# Patient Record
Sex: Female | Born: 1990 | Race: Black or African American | Hispanic: No | Marital: Married | Smoking: Never smoker
Health system: Southern US, Community
[De-identification: ages and names within clinical notes are randomized; demographics above are authoritative.]

## PROBLEM LIST (undated history)

## (undated) DIAGNOSIS — J45909 Unspecified asthma, uncomplicated: Secondary | ICD-10-CM

## (undated) DIAGNOSIS — F419 Anxiety disorder, unspecified: Secondary | ICD-10-CM

## (undated) DIAGNOSIS — D649 Anemia, unspecified: Secondary | ICD-10-CM

## (undated) HISTORY — DX: Anemia, unspecified: D64.9

## (undated) HISTORY — PX: OTHER SURGICAL HISTORY: SHX169

## (undated) HISTORY — DX: Anxiety disorder, unspecified: F41.9

## (undated) HISTORY — DX: Unspecified asthma, uncomplicated: J45.909

---

## 2018-05-20 ENCOUNTER — Other Ambulatory Visit: Payer: Self-pay

## 2018-05-20 ENCOUNTER — Emergency Department
Admission: EM | Admit: 2018-05-20 | Discharge: 2018-05-20 | Disposition: A | Discharge status: Home / Self Care | Payer: Self-pay | Attending: Emergency Medicine | Admitting: Emergency Medicine

## 2018-05-20 ENCOUNTER — Encounter: Payer: Self-pay | Admitting: Emergency Medicine

## 2018-05-20 DIAGNOSIS — L42 Pityriasis rosea: Secondary | ICD-10-CM | POA: Insufficient documentation

## 2018-05-20 MED ORDER — TRAMADOL HCL 50 MG PO TABS: 50.0000 mg | ORAL_TABLET | Freq: Four times a day (QID) | ORAL | 0 refills | Status: DC | PRN

## 2018-05-20 MED ORDER — HYDROXYZINE HCL 10 MG PO TABS: 10.0000 mg | ORAL_TABLET | Freq: Three times a day (TID) | ORAL | 0 refills | Status: AC | PRN

## 2018-05-20 MED ORDER — CICLOPIROX 8 % EX SOLN: Freq: Every day | CUTANEOUS | 0 refills | Status: DC

## 2018-05-20 NOTE — ED Triage Notes (Signed)
Itchy rash body x 4 days.

## 2018-05-21 NOTE — ED Provider Notes (Signed)
[facility] Emergency Department Provider Note  ____________________________________________  Time seen: Approximately 12:27 AM  I have reviewed the triage vital signs and the nursing notes.   HISTORY  Chief Complaint Rash    HPI Sharon Salazar is a 28 y.o. female presents to the emergency department with a scaling, maculopapular, circumferential rash that is pruritic in nature for the past 4 days.  Rash started with a large patch under her left arm and has since spread across the trunk.  Patient denies a prodrome of rhinorrhea, congestion or nonproductive cough.  No fever or chills.  Patient denies recent pharyngitis.  No other sick contacts in the home with similar symptoms.  Patient has not experienced similar symptoms in the past.  Patient also reports that she has an abnormal looking left great toenail.   History reviewed. No pertinent past medical history.  There are no active problems to display for this patient.   History reviewed. No pertinent surgical history.  Prior to Admission medications   Medication Sig Start Date End Date Taking? Authorizing Provider  ciclopirox (PENLAC) 8 % solution Apply topically at bedtime. Apply over nail and surrounding skin. Apply daily over previous coat. After seven (7) days, may remove with alcohol and continue cycle. 05/20/18   Orvil Feil, PA-C  hydrOXYzine (ATARAX/VISTARIL) 10 MG tablet Take 1 tablet (10 mg total) by mouth 3 (three) times daily as needed for up to 14 days. 05/20/18 06/03/18  Orvil Feil, PA-C    Allergies Patient has no known allergies.  No family history on file.  Social History Social History   Tobacco Use  . Smoking status: Not on file  Substance Use Topics  . Alcohol use: Not on file  . Drug use: Not on file     Review of Systems  Constitutional: No fever/chills Eyes: No visual changes. No discharge ENT: No upper respiratory complaints. Cardiovascular: no chest  pain. Respiratory: no cough. No SOB. Gastrointestinal: No abdominal pain.  No nausea, no vomiting.  No diarrhea.  No constipation. Genitourinary: Negative for dysuria. No hematuria Musculoskeletal: Negative for musculoskeletal pain. Skin: Patient has rash.  Neurological: Negative for headaches, focal weakness or numbness.   ____________________________________________   PHYSICAL EXAM:  VITAL SIGNS: ED Triage Vitals  Enc Vitals Group     BP 05/20/18 1749 136/66     Pulse Rate 05/20/18 1749 83     Resp 05/20/18 1749 18     Temp 05/20/18 1749 98.3 F (36.8 C)     Temp Source 05/20/18 1749 Oral     SpO2 05/20/18 1749 100 %     Weight 05/20/18 1750 142 lb (64.4 kg)     Height 05/20/18 1750 5\' 7"  (1.702 m)     Head Circumference --      Peak Flow --      Pain Score 05/20/18 1753 0     Pain Loc --      Pain Edu? --      Excl. in [place]? --      Constitutional: Alert and oriented. Well appearing and in no acute distress. Eyes: Conjunctivae are normal. PERRL. EOMI. Head: Atraumatic. Cardiovascular: Normal rate, regular rhythm. Normal S1 and S2.  Good peripheral circulation. Respiratory: Normal respiratory effort without tachypnea or retractions. Lungs CTAB. Good air entry to the bases with no decreased or absent breath sounds. Musculoskeletal: Full range of motion to all extremities. No gross deformities appreciated. Neurologic:  Normal speech and language. No gross focal neurologic deficits  are appreciated.  Skin: Patient has scaling, circumferential, maculopapular rash with herald patch visualized left upper arm.  Patient also has onychomycosis of the left great toe Psychiatric: Mood and affect are normal. Speech and behavior are normal. Patient exhibits appropriate insight and judgement.   ____________________________________________   LABS (all labs ordered are listed, but only abnormal results are displayed)  Labs Reviewed - No data to  display ____________________________________________  EKG   ____________________________________________  RADIOLOGY  No results found.  ____________________________________________    PROCEDURES  Procedure(s) performed:    Procedures    Medications - No data to display   ____________________________________________   INITIAL IMPRESSION / ASSESSMENT AND PLAN / ED COURSE  Pertinent labs & imaging results that were available during my care of the patient were reviewed by me and considered in my medical decision making (see chart for details).  Review of the  [place] was performed in accordance of the [facility] prior to dispensing any controlled drugs.      Assessment and plan Pityriasis rosea Onychomycosis Patient presents to the emergency department with a rash consistent with pityriasis rosea.  Patient education regarding the self-limiting nature of pityriasis rosea was given.  Hydroxyzine was recommended and prescribed for pruritus.  Patient was also prescribed ciclopirox for onychomycosis.  She was advised to follow-up with primary care as needed.  All patient questions were answered.    ____________________________________________  FINAL CLINICAL IMPRESSION(S) / ED DIAGNOSES  Final diagnoses:  Pityriasis rosea      NEW MEDICATIONS STARTED DURING THIS VISIT:  ED Discharge Orders         Ordered    traMADol (ULTRAM) 50 MG tablet  Every 6 hours PRN,   Status:  Discontinued     05/20/18 1955    traMADol (ULTRAM) 50 MG tablet  Every 6 hours PRN,   Status:  Discontinued     05/20/18 1955    hydrOXYzine (ATARAX/VISTARIL) 10 MG tablet  3 times daily PRN     05/20/18 1959    ciclopirox (PENLAC) 8 % solution  Daily at bedtime     05/20/18 1959              This chart was dictated using voice recognition software/Dragon. Despite best efforts to proofread, errors can occur which can change the meaning. Any change was purely unintentional.    Orvil Feil, PA-C 05/21/18 4383    Sharyn Creamer, MD 05/21/18 2209

## 2018-12-24 ENCOUNTER — Emergency Department: Payer: Self-pay

## 2018-12-24 ENCOUNTER — Encounter: Payer: Self-pay | Admitting: Emergency Medicine

## 2018-12-24 ENCOUNTER — Emergency Department
Admission: EM | Admit: 2018-12-24 | Discharge: 2018-12-24 | Disposition: A | Discharge status: Home / Self Care | Payer: Self-pay | Attending: Emergency Medicine | Admitting: Emergency Medicine

## 2018-12-24 ENCOUNTER — Other Ambulatory Visit: Payer: Self-pay

## 2018-12-24 DIAGNOSIS — Z23 Encounter for immunization: Secondary | ICD-10-CM | POA: Insufficient documentation

## 2018-12-24 DIAGNOSIS — S01112A Laceration without foreign body of left eyelid and periocular area, initial encounter: Secondary | ICD-10-CM | POA: Insufficient documentation

## 2018-12-24 DIAGNOSIS — S20212A Contusion of left front wall of thorax, initial encounter: Secondary | ICD-10-CM | POA: Insufficient documentation

## 2018-12-24 DIAGNOSIS — Y929 Unspecified place or not applicable: Secondary | ICD-10-CM | POA: Insufficient documentation

## 2018-12-24 DIAGNOSIS — Y999 Unspecified external cause status: Secondary | ICD-10-CM | POA: Insufficient documentation

## 2018-12-24 DIAGNOSIS — S0512XA Contusion of eyeball and orbital tissues, left eye, initial encounter: Secondary | ICD-10-CM | POA: Insufficient documentation

## 2018-12-24 DIAGNOSIS — S0181XA Laceration without foreign body of other part of head, initial encounter: Secondary | ICD-10-CM

## 2018-12-24 DIAGNOSIS — Y939 Activity, unspecified: Secondary | ICD-10-CM | POA: Insufficient documentation

## 2018-12-24 MED ORDER — IBUPROFEN 600 MG PO TABS: 600.0000 mg | ORAL_TABLET | Freq: Four times a day (QID) | ORAL | 0 refills | Status: DC | PRN

## 2018-12-24 MED ORDER — TETANUS-DIPHTHERIA TOXOIDS TD 5-2 LFU IM INJ
0.5000 mL | INJECTION | Freq: Once | INTRAMUSCULAR | Status: DC
  Filled 2018-12-24: qty 0.5

## 2018-12-24 MED ORDER — ACETAMINOPHEN 500 MG PO TABS
1000.0000 mg | ORAL_TABLET | Freq: Once | ORAL | Status: AC
  Administered 2018-12-24: 1000 mg via ORAL
  Filled 2018-12-24: qty 2

## 2018-12-24 MED ORDER — TETANUS-DIPHTH-ACELL PERTUSSIS 5-2.5-18.5 LF-MCG/0.5 IM SUSP
0.5000 mL | Freq: Once | INTRAMUSCULAR | Status: AC
  Administered 2018-12-24: 16:00:00 0.5 mL via INTRAMUSCULAR
  Filled 2018-12-24: qty 0.5
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
## 2018-12-24 NOTE — Discharge Instructions (Signed)
Please keep Dermabond clean and dry for 5 days.  In 5 days you may shower and get wet.  If any worsening headaches, vision changes, nausea, vomiting, chest pain, shortness of breath, return to the emergency department.  You may take ibuprofen and Tylenol as needed for pain.

## 2019-02-26 ENCOUNTER — Other Ambulatory Visit: Payer: Self-pay

## 2019-02-26 ENCOUNTER — Emergency Department
Admission: EM | Admit: 2019-02-26 | Discharge: 2019-02-26 | Disposition: A | Discharge status: Home / Self Care | Payer: Self-pay | Attending: Emergency Medicine | Admitting: Emergency Medicine

## 2019-02-26 ENCOUNTER — Encounter: Payer: Self-pay | Admitting: Emergency Medicine

## 2019-02-26 DIAGNOSIS — G44209 Tension-type headache, unspecified, not intractable: Secondary | ICD-10-CM | POA: Insufficient documentation

## 2019-02-26 DIAGNOSIS — B351 Tinea unguium: Secondary | ICD-10-CM | POA: Insufficient documentation

## 2019-02-26 MED ORDER — BUTALBITAL-APAP-CAFFEINE 50-325-40 MG PO TABS: 1.0000 | ORAL_TABLET | Freq: Four times a day (QID) | ORAL | 0 refills | Status: DC | PRN

## 2019-02-26 NOTE — Discharge Instructions (Signed)
[facility] for your [facility] fungus.  Begin taking the medication for your headache.  Do not drive or operate machinery while taking that medication as it could cause drowsiness and increase your risk for injury.  You may use ice or heat to your face and neck as needed for discomfort.  Call make an appointment with the ophthalmologist listed on your discharge papers also to have your vision checked.

## 2019-02-26 NOTE — ED Notes (Signed)
See triage note  Presents with left sided headache for 1 week  Denies any fever  States headaches is getting worse  Also has some body aches  Afebrile on arrival   States she has not tried any po meds for headache

## 2019-02-26 NOTE — ED Provider Notes (Signed)
[facility] Emergency Department Provider Note  ____________________________________________   None    (approximate)  I have reviewed the triage vital signs and the nursing notes.   HISTORY  Chief Complaint Headache   HPI Sharon Salazar is a 28 y.o. female presents to the ED with complaint of left-sided headache for 1 week.  Patient denies any head injury.  Patient is unaware of any fever and reports that she has not taken any over-the-counter medication for her headache.  Patient denies any visual changes other than she needs glasses.  She states that occasionally it feels like she "slept wrong on her neck".  She also complains of tenderness on the left side of her neck.  There is been no nausea or vomiting.  No previous history of headaches.  She denies any known Covid exposure or symptoms suspicious for Covid.  She rates her pain as 9 out of 10.     History reviewed. No pertinent past medical history.  There are no active problems to display for this patient.   History reviewed. No pertinent surgical history.  Prior to Admission medications   Medication Sig Start Date End Date Taking? Authorizing Provider  butalbital-acetaminophen-caffeine (FIORICET) 50-325-40 MG tablet Take 1 tablet by mouth every 6 (six) hours as needed for headache. 02/26/19 02/26/20  Johnn Hai, PA-C    Allergies Patient has no known allergies.  No family history on file.  Social History Social History   Tobacco Use  . Smoking status: Never Smoker  . Smokeless tobacco: Never Used  Substance Use Topics  . Alcohol use: Not Currently  . Drug use: Not on file    Review of Systems Constitutional: No fever/chills Eyes: No visual changes. ENT: No sore throat.  Minimal drainage noted posterior pharynx. Cardiovascular: Denies chest pain. Respiratory: Denies shortness of breath. Gastrointestinal: No abdominal pain.  No nausea, no vomiting.  No diarrhea.   Genitourinary: Negative for dysuria. Musculoskeletal: Positive left-sided cervical muscle pain.  Negative for muscle aches. Skin: Negative for rash. Neurological: Positive for headaches, negative for focal weakness or numbness.  ____________________________________________   PHYSICAL EXAM:  VITAL SIGNS: ED Triage Vitals  Enc Vitals Group     BP 02/26/19 0723 120/60     Pulse Rate 02/26/19 0723 60     Resp 02/26/19 0723 16     Temp 02/26/19 0723 98.6 F (37 C)     Temp Source 02/26/19 0723 Oral     SpO2 02/26/19 0723 100 %     Weight 02/26/19 0720 150 lb (68 kg)     Height 02/26/19 0720 5\' 7"  (1.702 m)     Head Circumference --      Peak Flow --      Pain Score 02/26/19 0720 9     Pain Loc --      Pain Edu? --      Excl. in [place]? --    Constitutional: Alert and oriented. Well appearing and in no acute distress. Eyes: Conjunctivae are normal. PERRL. EOMI. Head: Atraumatic. Nose: No congestion/rhinnorhea. Mouth/Throat: Mucous membranes are moist.  Oropharynx non-erythematous.  Mild posterior drainage noted. Neck: No stridor.  There is some left lateral cervical muscle tenderness along with trapezius muscle tenderness.  Range of motion is nonrestricted however patient reports increased muscle tenderness with lateral range of motion of stress to the left trapezius muscle.  No nuchal rigidity noted. Hematological/Lymphatic/Immunilogical: No cervical lymphadenopathy. Cardiovascular: Normal rate, regular rhythm. Grossly normal heart sounds.  Good peripheral  circulation. Respiratory: Normal respiratory effort.  No retractions. Lungs CTAB. Musculoskeletal: Moves upper and lower extremities any difficulty.  Normal gait was noted. Neurologic:  Normal speech and language. No gross focal neurologic deficits are appreciated.  Cranial nerves II through XII grossly intact.  No gait instability. Skin:  Skin is warm, dry and intact. No rash noted. Psychiatric: Mood and affect are normal. Speech  and behavior are normal.  ____________________________________________   LABS (all labs ordered are listed, but only abnormal results are displayed)  Labs Reviewed - No data to display  PROCEDURES  Procedure(s) performed (including Critical Care):  Procedures ____________________________________________   INITIAL IMPRESSION / ASSESSMENT AND PLAN / ED COURSE  As part of my medical decision making, I reviewed the following data within the electronic MEDICAL RECORD NUMBER Notes from prior ED visits and [place] Controlled Substance Database  28 year old female presents presents to the ED with complaint of left-sided headache for 1 week.  There is been no history of injury and no symptoms suspicious for Covid.  Patient is afebrile and no visual changes other than patient states she needs glasses.  Does not take any over-the-counter medication for her headache.  Patient was able to drive herself to the ED.  Cranial nerves II through XII grossly intact and remaining physical exam was unremarkable with the exception of some left trapezius muscle and cervical muscle tenderness which can be reproduced with lateral range of motion.  Patient was given prescription for Fioricet to be taken at home.  She is encouraged to use ice or heat to her neck as needed for discomfort.  She will follow-up with her PCP if any continued problems.  ____________________________________________   FINAL CLINICAL IMPRESSION(S) / ED DIAGNOSES  Final diagnoses:  Muscle contraction headache  Fungal infection of nail     ED Discharge Orders         Ordered    butalbital-acetaminophen-caffeine (FIORICET) 50-325-40 MG tablet  Every 6 hours PRN     02/26/19 0759           Note:  This document was prepared using Dragon voice recognition software and may include unintentional dictation errors.    Tommi Rumps, PA-C 02/26/19 1211    Concha Se, MD 03/02/19 [phone]

## 2019-02-26 NOTE — ED Triage Notes (Signed)
Pt c/o headache to back left side of head to sinus pain and muscle aches for the past few days. NAD.

## 2019-04-26 ENCOUNTER — Emergency Department: Payer: Self-pay

## 2019-04-26 ENCOUNTER — Emergency Department
Admission: EM | Admit: 2019-04-26 | Discharge: 2019-04-26 | Disposition: A | Discharge status: Home / Self Care | Payer: Self-pay | Attending: Emergency Medicine | Admitting: Emergency Medicine

## 2019-04-26 ENCOUNTER — Other Ambulatory Visit: Payer: Self-pay

## 2019-04-26 ENCOUNTER — Encounter: Payer: Self-pay | Admitting: Emergency Medicine

## 2019-04-26 DIAGNOSIS — M79642 Pain in left hand: Secondary | ICD-10-CM | POA: Insufficient documentation

## 2019-04-26 DIAGNOSIS — K0889 Other specified disorders of teeth and supporting structures: Secondary | ICD-10-CM | POA: Insufficient documentation

## 2019-04-26 MED ORDER — NAPROXEN 500 MG PO TABS: 500.0000 mg | ORAL_TABLET | Freq: Two times a day (BID) | ORAL | 0 refills | Status: DC

## 2019-04-26 MED ORDER — AMOXICILLIN 875 MG PO TABS: 875.0000 mg | ORAL_TABLET | Freq: Two times a day (BID) | ORAL | 0 refills | Status: DC

## 2019-04-26 NOTE — ED Triage Notes (Signed)
Patient presents to the ED with left hand pain and swollen joints.  Patient denies any known injury.  Patient also reports some pain to her left arm, reports history of healed injury to left arm.  Patient states she is having an intermittent headache as well but no headache at this time.

## 2019-04-26 NOTE — ED Notes (Signed)
See triage note  Presents with pain to left hand  States she hit her hand on the dresser   having some pain and stiffness to hand  Also thinks she may have a dental infection pain and swelling noted to left side of face

## 2019-04-26 NOTE — ED Provider Notes (Signed)
[facility] Emergency Department Provider Note  ____________________________________________   First MD Initiated Contact with Patient 04/26/19 1119     (approximate)  I have reviewed the triage vital signs and the nursing notes.   HISTORY  Chief Complaint Hand Pain   HPI Sharon Salazar is a 29 y.o. female presents to the ED with complaint of left hand pain that started today.  Patient states she hit her hand on a dresser and has been having some pain and stiffness.  Patient is not taking any over-the-counter medication for this.  She also complains of dental pain on the left upper side.  She denies any fever, chills, nausea or vomiting.  She has not seen a dentist for her tooth.  She rates her pain as an 8 out of 10.       History reviewed. No pertinent past medical history.  There are no problems to display for this patient.   History reviewed. No pertinent surgical history.  Prior to Admission medications   Medication Sig Start Date End Date Taking? Authorizing Provider  amoxicillin (AMOXIL) 875 MG tablet Take 1 tablet (875 mg total) by mouth 2 (two) times daily. 04/26/19   Johnn Hai, PA-C  naproxen (NAPROSYN) 500 MG tablet Take 1 tablet (500 mg total) by mouth 2 (two) times daily with a meal. 04/26/19   Johnn Hai, PA-C    Allergies Patient has no known allergies.  No family history on file.  Social History Social History   Tobacco Use  . Smoking status: Never Smoker  . Smokeless tobacco: Never Used  Substance Use Topics  . Alcohol use: Not Currently  . Drug use: Not on file    Review of Systems Constitutional: No fever/chills Eyes: No visual changes. ENT: No sore throat.  Positive for dental pain. Cardiovascular: Denies chest pain. Respiratory: Denies shortness of breath. Gastrointestinal:  No nausea, no vomiting.  Genitourinary: Negative for dysuria. Musculoskeletal: Positive for left hand pain. Skin: Negative for  rash. Neurological: Negative for  focal weakness or numbness. ____________________________________________   PHYSICAL EXAM:  VITAL SIGNS: ED Triage Vitals [04/26/19 1104]  Enc Vitals Group     BP 118/63     Pulse Rate 64     Resp 16     Temp 98.9 F (37.2 C)     Temp Source Oral     SpO2 100 %     Weight 160 lb (72.6 kg)     Height 5\' 7"  (1.702 m)     Head Circumference      Peak Flow      Pain Score 8     Pain Loc      Pain Edu?      Excl. in [place]?    Constitutional: Alert and oriented. Well appearing and in no acute distress. Eyes: Conjunctivae are normal. Head: Atraumatic. Nose: No congestion/rhinnorhea. Mouth/Throat: Mucous membranes are moist.  Oropharynx non-erythematous.  On exam of the left upper premolar there is no obvious abscess.  Area is tender to palpation.  No drainage is noted. Neck: No stridor.   Hematological/Lymphatic/Immunilogical: No cervical lymphadenopathy. Cardiovascular: Normal rate, regular rhythm. Grossly normal heart sounds.  Good peripheral circulation. Respiratory: Normal respiratory effort.  No retractions. Lungs CTAB. Musculoskeletal: Examination of left hand there is no gross deformity however there is tenderness on palpation of the third, fourth and fifth metacarpal.  No soft tissue injury or edema is present.  Patient is able to move digits without any difficulty.  Skin is intact.  Capillary refill is less than 3 seconds and patient is able to flex and extend digits without any difficulty. Neurologic:  Normal speech and language. No gross focal neurologic deficits are appreciated. No gait instability. Skin:  Skin is warm, dry and intact.  No skin discoloration noted on the left hand. Psychiatric: Mood and affect are normal. Speech and behavior are normal.  ____________________________________________   LABS (all labs ordered are listed, but only abnormal results are displayed)  Labs Reviewed - No data to display RADIOLOGY   Official  radiology report(s): DG Hand Complete Left  Result Date: 04/26/2019 CLINICAL DATA:  Pain after hitting hand on solid object EXAM: LEFT HAND - COMPLETE 3+ VIEW COMPARISON:  None. FINDINGS: Frontal, oblique, and lateral views obtained. No evident fracture or dislocation. Joint spaces appear normal. No erosive change. IMPRESSION: No fracture or dislocation.  No appreciable arthropathy. Electronically Signed   By: Bretta Bang III M.D.   On: 04/26/2019 12:24    ____________________________________________   PROCEDURES  Procedure(s) performed (including Critical Care):  Procedures   ____________________________________________   INITIAL IMPRESSION / ASSESSMENT AND PLAN / ED COURSE  As part of my medical decision making, I reviewed the following data within the electronic MEDICAL RECORD NUMBER Notes from prior ED visits and [place] Controlled Substance Database  29 year old female presents to the ED with complaint of left hand pain after she hit a dresser.  She states that she has not taken any over-the-counter medication.  She states that she injured her left arm many years ago and that has healed.  Also she complained of dental pain on the left upper premolars.  X-rays were reassuring and patient was made aware that she does not have an acute bony injury.  No obvious dental abscess was noted but patient was placed on Amoxil 875 twice daily for 10 days and naproxen 500 mg twice daily with food.  Patient is to return to the emergency department if any severe worsening of her symptoms or urgent concerns.  ____________________________________________   FINAL CLINICAL IMPRESSION(S) / ED DIAGNOSES  Final diagnoses:  Left hand pain  Pain, dental     ED Discharge Orders         Ordered    naproxen (NAPROSYN) 500 MG tablet  2 times daily with meals     04/26/19 1237    amoxicillin (AMOXIL) 875 MG tablet  2 times daily     04/26/19 1237           Note:  This document was prepared using  Dragon voice recognition software and may include unintentional dictation errors.    Tommi Rumps, PA-C 04/26/19 1248    Emily Filbert, MD 04/26/19 1420

## 2019-04-26 NOTE — Discharge Instructions (Signed)
Follow-up with your primary care provider if any continued problems with your left hand.  Also begin taking the naproxen 500 mg twice daily with food which will help with inflammation.  The amoxicillin is twice a day for the next 10 days for your dental pain.  Also a list of dental clinics is listed on your discharge papers for you to follow-up with.  OPTIONS FOR DENTAL FOLLOW UP CARE  [place] Department of Health and Human Services - Local Safety Net Dental Clinics [email or web address]   [facility] [phone])  Sharl Ma [phone])  [place] [phone])  [facility] [phone])  [facility] [phone]) This clinic caters to the indigent population and is on a lottery system. Location: [employer] [facility], [employer], [address] [place] Clinic Hours: Wednesdays from 6pm - 9pm, patients seen by a lottery system. For dates, call or go to [email or web address] Services: Cleanings, fillings and simple extractions. Payment Options: DENTAL WORK IS FREE OF CHARGE. Bring proof of income or support. Best way to get seen: Arrive at 5:15 pm - this is a lottery, NOT first come/first serve, so arriving earlier will not increase your chances of being seen.     [facility] [phone] Select option 1 for emergencies   Location: [facility] of Dentistry, [place], [address] [place] Clinic Hours: No walk-ins accepted - call the day before to schedule an appointment. Check in times are 9:30 am and 1:30 pm. Services: Simple extractions, temporary fillings, pulpectomy/pulp debridement, uncomplicated abscess drainage. Payment Options: PAYMENT IS DUE AT THE TIME OF SERVICE.  Fee is usually $100-200, additional surgical procedures (e.g. abscess drainage) may be extra. Cash, checks,  Visa/MasterCard accepted.  Can file Medicaid if patient is covered for dental - patient should call case worker to check. No discount for [facility] patients. Best way to get seen: MUST call the day before and get onto the schedule. Can usually be seen the next 1-2 days. No walk-ins accepted.     [facility] [phone]   Location: [facility], [address] [place] Clinic Hours: M, W, Th, F 8am or 1:30pm, Tues 9a or 1:30 - first come/first served. Services: Simple extractions, temporary fillings, uncomplicated abscess drainage.  You do not need to be an [facility] resident. Payment Options: PAYMENT IS DUE AT THE TIME OF SERVICE. Dental insurance, otherwise sliding scale - bring proof of income or support. Depending on income and treatment needed, cost is usually $50-200. Best way to get seen: Arrive early as it is first come/first served.     [facility] [phone]   Location: [address] Clinic Hours: Mon-Thu 8a-5p Services: Most basic dental services including extractions and fillings. Payment Options: PAYMENT IS DUE AT THE TIME OF SERVICE. Sliding scale, up to 50% off - bring proof if income or support. Medicaid with dental option accepted. Best way to get seen: Call to schedule an appointment, can usually be seen within 2 weeks OR they will try to see walk-ins - show up at 8a or 2p (you may have to wait).     [facility] [phone] [place]IDENTS ONLY   Location: [facility], [address] [place], [place] [address] Clinic Hours: By appointment only. Monday - Thursday 8am-5pm, Friday 8am-12pm Services: Cleanings, fillings, extractions. Payment Options: PAYMENT IS DUE AT THE TIME OF SERVICE. Cash, [employer]  or MasterCard. Sliding scale - $30 minimum per service. Best way to get seen: Come in to office, complete packet and  make an appointment - need proof of income or support monies for each household member and proof of [facility] residence. Usually takes about a month to get in.     [facility] [phone]   Location: [address] [place] Clinic Hours: Walk-in Urgent Care Dental Services are offered Monday-Friday mornings only. The numbers of emergencies accepted daily is limited to the number of providers available. Maximum 15 - Mondays, Wednesdays & Thursdays Maximum 10 - Tuesdays & Fridays Services: You do not need to be a [facility] resident to be seen for a dental emergency. Emergencies are defined as pain, swelling, abnormal bleeding, or dental trauma. Walkins will receive x-rays if needed. NOTE: Dental cleaning is not an emergency. Payment Options: PAYMENT IS DUE AT THE TIME OF SERVICE. Minimum co-pay is $40.00 for uninsured patients. Minimum co-pay is $3.00 for Medicaid with dental coverage. Dental Insurance is accepted and must be presented at time of visit. Medicare does not cover dental. Forms of payment: Cash, credit card, checks. Best way to get seen: If not previously registered with the clinic, walk-in dental registration begins at 7:15 am and is on a first come/first serve basis. If previously registered with the clinic, call to make an appointment.     [facility] [place] ONLY   Location: [address] [place], [place] Clinic Hours: Mon-Thu 10a-2p Services: Extractions only! Payment Options: FREE (donations accepted) - bring proof of income or support Best way to get seen: Call and schedule an appointment OR come at 8am on the 1st Monday of every month (except for holidays) when it is first come/first served.     [facility] [phone]   Location: [place], [place] Clinic Hours: Friday mornings Services, Payment Options, Best way to get seen: Call for info

## 2019-11-07 ENCOUNTER — Emergency Department: Admission: EM | Admit: 2019-11-07 | Discharge: 2019-11-07 | Discharge status: Against Medical Advice | Payer: Self-pay

## 2020-04-24 ENCOUNTER — Other Ambulatory Visit
Admission: RE | Admit: 2020-04-24 | Discharge: 2020-04-24 | Disposition: A | Discharge status: Home / Self Care | Payer: 59 | Source: Ambulatory Visit | Attending: Pediatrics | Admitting: Pediatrics

## 2020-04-24 ENCOUNTER — Other Ambulatory Visit: Payer: Self-pay | Admitting: Student

## 2020-04-24 DIAGNOSIS — M79605 Pain in left leg: Secondary | ICD-10-CM

## 2020-04-24 DIAGNOSIS — R002 Palpitations: Secondary | ICD-10-CM | POA: Diagnosis present

## 2020-04-24 LAB — BRAIN NATRIURETIC PEPTIDE: B Natriuretic Peptide: 21.6 pg/mL (ref 0.0–100.0)

## 2020-04-25 ENCOUNTER — Encounter: Payer: Self-pay | Admitting: Anesthesiology

## 2020-04-25 ENCOUNTER — Other Ambulatory Visit: Payer: Self-pay

## 2020-04-25 ENCOUNTER — Ambulatory Visit
Admission: RE | Admit: 2020-04-25 | Discharge: 2020-04-25 | Disposition: A | Discharge status: Home / Self Care | Payer: 59 | Source: Ambulatory Visit | Attending: Student | Admitting: Student

## 2020-04-25 DIAGNOSIS — M79605 Pain in left leg: Secondary | ICD-10-CM

## 2020-07-19 ENCOUNTER — Emergency Department
Admission: EM | Admit: 2020-07-19 | Discharge: 2020-07-19 | Disposition: A | Discharge status: Home / Self Care | Payer: 59 | Attending: Physician Assistant | Admitting: Physician Assistant

## 2020-07-19 ENCOUNTER — Encounter: Payer: Self-pay | Admitting: Emergency Medicine

## 2020-07-19 ENCOUNTER — Other Ambulatory Visit: Payer: Self-pay

## 2020-07-19 DIAGNOSIS — X58XXXA Exposure to other specified factors, initial encounter: Secondary | ICD-10-CM | POA: Diagnosis not present

## 2020-07-19 DIAGNOSIS — S025XXA Fracture of tooth (traumatic), initial encounter for closed fracture: Secondary | ICD-10-CM | POA: Diagnosis not present

## 2020-07-19 DIAGNOSIS — S0993XA Unspecified injury of face, initial encounter: Secondary | ICD-10-CM | POA: Diagnosis present

## 2020-07-19 MED ORDER — AMOXICILLIN 500 MG PO CAPS: 500.0000 mg | ORAL_CAPSULE | Freq: Three times a day (TID) | ORAL | 20 refills | Status: DC

## 2020-07-19 MED ORDER — IBUPROFEN 800 MG PO TABS: 800.0000 mg | ORAL_TABLET | Freq: Three times a day (TID) | ORAL | 0 refills | Status: DC | PRN

## 2020-07-19 MED ORDER — OXYCODONE-ACETAMINOPHEN 7.5-325 MG PO TABS: 1.0000 | ORAL_TABLET | Freq: Four times a day (QID) | ORAL | 0 refills | Status: DC | PRN

## 2020-07-19 NOTE — ED Triage Notes (Signed)
Pt reports broken tooth on right lower jaw for awhile and now with pain to right jaw

## 2020-07-19 NOTE — Discharge Instructions (Signed)
Follow-up for scheduled dental appointment.

## 2020-07-19 NOTE — ED Notes (Signed)
Patient reports pain to the right jaw x several days. Patient reports broken tooth to right jaw. Patient reports she does have a dentist, but has not seen them yet.

## 2020-07-19 NOTE — ED Provider Notes (Signed)
[facility] Emergency Department Provider Note   ____________________________________________   Event Date/Time   First MD Initiated Contact with Patient 07/19/20 1731     (approximate)  I have reviewed the triage vital signs and the nursing notes.   HISTORY  Chief Complaint Dental Pain    HPI Sharon Salazar is a 30 y.o. female patient presents with dental pain secondary to a fractured right lower molar.  Patient states she cannot see the dentist within the month.  Patient rates her pain as a 10/10.  Patient no relief with over-the-counter Tylenol.  Patient described pain as "achy".         History reviewed. No pertinent past medical history.  There are no problems to display for this patient.   History reviewed. No pertinent surgical history.  Prior to Admission medications   Medication Sig Start Date End Date Taking? Authorizing Provider  amoxicillin (AMOXIL) 500 MG capsule Take 1 capsule (500 mg total) by mouth 3 (three) times daily. 07/19/20  Yes Joni Reining, PA-C  ibuprofen (ADVIL) 800 MG tablet Take 1 tablet (800 mg total) by mouth every 8 (eight) hours as needed. 07/19/20  Yes Joni Reining, PA-C  oxyCODONE-acetaminophen (PERCOCET) 7.5-325 MG tablet Take 1 tablet by mouth every 6 (six) hours as needed for severe pain. 07/19/20  Yes Joni Reining, PA-C  amoxicillin (AMOXIL) 875 MG tablet Take 1 tablet (875 mg total) by mouth 2 (two) times daily. 04/26/19   Tommi Rumps, PA-C  naproxen (NAPROSYN) 500 MG tablet Take 1 tablet (500 mg total) by mouth 2 (two) times daily with a meal. 04/26/19   Tommi Rumps, PA-C    Allergies Shellfish allergy, Cranberry, and Other  No family history on file.  Social History Social History   Tobacco Use  . Smoking status: Never Smoker  . Smokeless tobacco: Never Used  Substance Use Topics  . Alcohol use: Not Currently    Review of Systems  Constitutional: No fever/chills Eyes: No  visual changes. ENT: No sore throat.  Dental pain. Cardiovascular: Denies chest pain. Respiratory: Denies shortness of breath. Gastrointestinal: No abdominal pain.  No nausea, no vomiting.  No diarrhea.  No constipation. Genitourinary: Negative for dysuria. Musculoskeletal: Negative for back pain. Skin: Negative for rash. Neurological: Negative for headaches, focal weakness or numbness. Allergic/Immunilogical: Shellfish and cranberries.  ____________________________________________   PHYSICAL EXAM:  VITAL SIGNS: ED Triage Vitals  Enc Vitals Group     BP 07/19/20 1713 112/75     Pulse Rate 07/19/20 1713 (!) 106     Resp 07/19/20 1713 20     Temp 07/19/20 1713 98.8 F (37.1 C)     Temp Source 07/19/20 1713 Oral     SpO2 07/19/20 1713 100 %     Weight 07/19/20 1713 142 lb (64.4 kg)     Height 07/19/20 1713 5\' 7"  (1.702 m)     Head Circumference --      Peak Flow --      Pain Score 07/19/20 1711 10     Pain Loc --      Pain Edu? --      Excl. in [place]? --     Constitutional: Alert and oriented. Well appearing and in no acute distress. Eyes: Conjunctivae are normal. PERRL. EOMI. Head: Atraumatic. Nose: No congestion/rhinnorhea. Mouth/Throat: Mucous membranes are moist.  Oropharynx non-erythematous.  Fractured right lower molar. Hematological/Lymphatic/Immunilogical: No cervical lymphadenopathy. Cardiovascular: Tachycardic, regular rhythm. Grossly normal heart sounds.  Good peripheral  circulation. Respiratory: Normal respiratory effort.  No retractions. Lungs CTAB. Skin:  Skin is warm, dry and intact. No rash noted.  ____________________________________________   LABS (all labs ordered are listed, but only abnormal results are displayed)  Labs Reviewed - No data to display ____________________________________________  EKG   ____________________________________________  RADIOLOGY I, Joni Reining, personally viewed and evaluated these images (plain radiographs) as  part of my medical decision making, as well as reviewing the written report by the radiologist.  ED MD interpretation:    Official radiology report(s): No results found.  ____________________________________________   PROCEDURES  Procedure(s) performed (including Critical Care):  Procedures   ____________________________________________   INITIAL IMPRESSION / ASSESSMENT AND PLAN / ED COURSE  As part of my medical decision making, I reviewed the following data within the electronic MEDICAL RECORD NUMBER         Patient presents with dental pain secondary to fractured right lower molar.  Patient given discharge care instruction advised take medication as directed.  Follow-up with scheduled dental appointment.      ____________________________________________   FINAL CLINICAL IMPRESSION(S) / ED DIAGNOSES  Final diagnoses:  Closed fracture of tooth, initial encounter     ED Discharge Orders         Ordered    amoxicillin (AMOXIL) 500 MG capsule  3 times daily        07/19/20 1745    oxyCODONE-acetaminophen (PERCOCET) 7.5-325 MG tablet  Every 6 hours PRN        07/19/20 1745    ibuprofen (ADVIL) 800 MG tablet  Every 8 hours PRN        07/19/20 1745          *Please note:  Sharon Salazar was evaluated in Emergency Department on 07/19/2020 for the symptoms described in the history of present illness. She was evaluated in the context of the global COVID-19 pandemic, which necessitated consideration that the patient might be at risk for infection with the SARS-CoV-2 virus that causes COVID-19. Institutional protocols and algorithms that pertain to the evaluation of patients at risk for COVID-19 are in a state of rapid change based on information released by regulatory bodies including the CDC and federal and state organizations. These policies and algorithms were followed during the patient's care in the ED.  Some ED evaluations and interventions may be delayed as a result of  limited staffing during and the pandemic.*   Note:  This document was prepared using Dragon voice recognition software and may include unintentional dictation errors.    Joni Reining, PA-C 07/19/20 1749    Sharman Cheek, MD 07/22/20 [phone]

## 2020-08-30 ENCOUNTER — Other Ambulatory Visit: Payer: Self-pay

## 2020-08-30 ENCOUNTER — Encounter: Payer: Self-pay | Admitting: Emergency Medicine

## 2020-08-30 ENCOUNTER — Emergency Department
Admission: EM | Admit: 2020-08-30 | Discharge: 2020-08-30 | Disposition: A | Discharge status: Home / Self Care | Payer: 59 | Attending: Emergency Medicine | Admitting: Emergency Medicine

## 2020-08-30 DIAGNOSIS — K0381 Cracked tooth: Secondary | ICD-10-CM | POA: Insufficient documentation

## 2020-08-30 DIAGNOSIS — S025XXA Fracture of tooth (traumatic), initial encounter for closed fracture: Secondary | ICD-10-CM

## 2020-08-30 MED ORDER — AMOXICILLIN 875 MG PO TABS: 875.0000 mg | ORAL_TABLET | Freq: Two times a day (BID) | ORAL | 0 refills | Status: DC

## 2020-08-30 MED ORDER — OXYCODONE-ACETAMINOPHEN 7.5-325 MG PO TABS: 1.0000 | ORAL_TABLET | Freq: Four times a day (QID) | ORAL | 0 refills | Status: DC | PRN

## 2020-08-30 MED ORDER — NAPROXEN 500 MG PO TABS: 500.0000 mg | ORAL_TABLET | Freq: Two times a day (BID) | ORAL | Status: DC

## 2020-08-30 MED ORDER — FLUCONAZOLE 150 MG PO TABS: 150.0000 mg | ORAL_TABLET | Freq: Every day | ORAL | 0 refills | Status: DC

## 2020-08-30 MED ORDER — LIDOCAINE VISCOUS HCL 2 % MT SOLN: 5.0000 mL | Freq: Four times a day (QID) | OROMUCOSAL | 0 refills | Status: DC | PRN

## 2020-08-30 NOTE — Discharge Instructions (Addendum)
Follow-up with residency clinic supplied in your discharge care instructions. OPTIONS FOR DENTAL FOLLOW UP CARE  [place] Department of Health and Human Services - Local Safety Net Dental Clinics [email or web address]   [facility] [phone])  [facility] Ma [phone])  [place] [phone])  [facility] [phone])  [facility] [phone]) This clinic caters to the indigent population and is on a lottery system. Location: [employer] [facility], [employer], [address] [place] Clinic Hours: Wednesdays from 6pm - 9pm, patients seen by a lottery system. For dates, call or go to [email or web address] Services: Cleanings, fillings and simple extractions. Payment Options: DENTAL WORK IS FREE OF CHARGE. Bring proof of income or support. Best way to get seen: Arrive at 5:15 pm - this is a lottery, NOT first come/first serve, so arriving earlier will not increase your chances of being seen.     [facility] [phone] Select option 1 for emergencies   Location: [facility] of Dentistry, [place], [address], [place] Clinic Hours: No walk-ins accepted - call the day before to schedule an appointment. Check in times are 9:30 am and 1:30 pm. Services: Simple extractions, temporary fillings, pulpectomy/pulp debridement, uncomplicated abscess drainage. Payment Options: PAYMENT IS DUE AT THE TIME OF SERVICE.  Fee is usually $100-200, additional surgical procedures (e.g. abscess drainage) may be extra. Cash, checks, Visa/MasterCard accepted.  Can file Medicaid if patient is covered for dental - patient should call case worker to check. No discount for [facility] patients. Best way to get seen: MUST call the day before and get onto the schedule. Can usually be seen the next 1-2 days. No  walk-ins accepted.     [facility] Dental Services [phone]   Location: [facility], [address] [place] Clinic Hours: M, W, Th, F 8am or 1:30pm, Tues 9a or 1:30 - first come/first served. Services: Simple extractions, temporary fillings, uncomplicated abscess drainage.  You do not need to be an [facility] resident. Payment Options: PAYMENT IS DUE AT THE TIME OF SERVICE. Dental insurance, otherwise sliding scale - bring proof of income or support. Depending on income and treatment needed, cost is usually $50-200. Best way to get seen: Arrive early as it is first come/first served.     [facility] [phone]   Location: [address] Clinic Hours: Mon-Thu 8a-5p Services: Most basic dental services including extractions and fillings. Payment Options: PAYMENT IS DUE AT THE TIME OF SERVICE. Sliding scale, up to 50% off - bring proof if income or support. Medicaid with dental option accepted. Best way to get seen: Call to schedule an appointment, can usually be seen within 2 weeks OR they will try to see walk-ins - show up at 8a or 2p (you may have to wait).     [facility] [phone] [place]IDENTS ONLY   Location: [facility], [address], [place], [place] [address] Clinic Hours: By appointment only. Monday - Thursday 8am-5pm, Friday 8am-12pm Services: Cleanings, fillings, extractions. Payment Options: PAYMENT IS DUE AT THE TIME OF SERVICE. Cash, Visa or MasterCard. Sliding scale - $30 minimum per service. Best way to get seen: Come in to office, complete packet and make an appointment - need proof of income or support monies for each household member and proof of [facility] residence. Usually takes about a month to get in.     [facility]  Services Dental Clinic [phone]   Location: [address], [facility] Hours:  Walk-in Urgent Care Dental Services are offered Monday-Friday mornings only. The numbers of emergencies accepted daily is limited to the number of providers available. Maximum 15 - Mondays, Wednesdays & Thursdays Maximum 10 - Tuesdays & Fridays Services: You do not need to be a [facility] resident to be seen for a dental emergency. Emergencies are defined as pain, swelling, abnormal bleeding, or dental trauma. Walkins will receive x-rays if needed. NOTE: Dental cleaning is not an emergency. Payment Options: PAYMENT IS DUE AT THE TIME OF SERVICE. Minimum co-pay is $40.00 for uninsured patients. Minimum co-pay is $3.00 for Medicaid with dental coverage. Dental Insurance is accepted and must be presented at time of visit. Medicare does not cover dental. Forms of payment: Cash, credit card, checks. Best way to get seen: If not previously registered with the clinic, walk-in dental registration begins at 7:15 am and is on a first come/first serve basis. If previously registered with the clinic, call to make an appointment.     [facility] [phone] [place] RESIDENTS ONLY   Location: [address] [place], [place] Clinic Hours: Mon-Thu 10a-2p Services: Extractions only! Payment Options: FREE (donations accepted) - bring proof of income or support Best way to get seen: Call and schedule an appointment OR come at 8am on the 1st Monday of every month (except for holidays) when it is first come/first served.     [facility] [phone]   Location: [address] [place], [place] Clinic Hours: Friday mornings Services, Payment Options, Best way to get seen: Call for info

## 2020-08-30 NOTE — ED Provider Notes (Signed)
[facility] Emergency Department Provider Note   ____________________________________________   Event Date/Time   First MD Initiated Contact with Patient 08/30/20 1114     (approximate)  I have reviewed the triage vital signs and the nursing notes.   HISTORY  Chief Complaint Dental Pain    HPI Sharon Salazar is a 30 y.o. female patient returns for complaint of dental pain secondary to fractured tooth.  Patient was seen in this facility on 07/19/2020 for fractured right lower molar.  Patient was given medication discharge care instruction stated she had a dental appointment.  Patient states her dental appointment was canceled.  Patient that she call to reschedule and stated no answer from the office.  Patient that she looked online and found they were out of business.  Patient asking for full list of dental clinics for follow-up care.  Rates her pain as a 10/10.  Described pain as "achy".         History reviewed. No pertinent past medical history.  There are no problems to display for this patient.   History reviewed. No pertinent surgical history.  Prior to Admission medications   Medication Sig Start Date End Date Taking? Authorizing Provider  amoxicillin (AMOXIL) 875 MG tablet Take 1 tablet (875 mg total) by mouth 2 (two) times daily. 08/30/20  Yes Joni Reining, PA-C  lidocaine (XYLOCAINE) 2 % solution Use as directed 5 mLs in the mouth or throat every 6 (six) hours as needed for mouth pain. 08/30/20  Yes Joni Reining, PA-C  naproxen (NAPROSYN) 500 MG tablet Take 1 tablet (500 mg total) by mouth 2 (two) times daily with a meal. 08/30/20  Yes Joni Reining, PA-C  oxyCODONE-acetaminophen (PERCOCET) 7.5-325 MG tablet Take 1 tablet by mouth every 6 (six) hours as needed for severe pain. 08/30/20  Yes Joni Reining, PA-C  amoxicillin (AMOXIL) 500 MG capsule Take 1 capsule (500 mg total) by mouth 3 (three) times daily. 07/19/20   Joni Reining, PA-C   amoxicillin (AMOXIL) 875 MG tablet Take 1 tablet (875 mg total) by mouth 2 (two) times daily. 04/26/19   Tommi Rumps, PA-C  ibuprofen (ADVIL) 800 MG tablet Take 1 tablet (800 mg total) by mouth every 8 (eight) hours as needed. 07/19/20   Joni Reining, PA-C  naproxen (NAPROSYN) 500 MG tablet Take 1 tablet (500 mg total) by mouth 2 (two) times daily with a meal. 04/26/19   Tommi Rumps, PA-C  oxyCODONE-acetaminophen (PERCOCET) 7.5-325 MG tablet Take 1 tablet by mouth every 6 (six) hours as needed for severe pain. 07/19/20   Joni Reining, PA-C    Allergies Shellfish allergy, Cranberry, and Other  No family history on file.  Social History Social History   Tobacco Use  . Smoking status: Never Smoker  . Smokeless tobacco: Never Used  Substance Use Topics  . Alcohol use: Not Currently    Review of Systems Constitutional: No fever/chills Eyes: No visual changes. ENT: No sore throat.  Dental pain. Cardiovascular: Denies chest pain. Respiratory: Denies shortness of breath. Gastrointestinal: No abdominal pain.  No nausea, no vomiting.  No diarrhea.  No constipation. Genitourinary: Negative for dysuria. Musculoskeletal: Negative for back pain. Skin: Negative for rash. Neurological: Negative for headaches, focal weakness or numbness. Allergic/Immunilogical: Shellfish and cranberries. ____________________________________________   PHYSICAL EXAM:  VITAL SIGNS: ED Triage Vitals  Enc Vitals Group     BP 08/30/20 1105 122/70     Pulse Rate 08/30/20 1105  79     Resp 08/30/20 1105 17     Temp 08/30/20 1105 98.6 F (37 C)     Temp Source 08/30/20 1105 Oral     SpO2 08/30/20 1105 95 %     Weight 08/30/20 1046 142 lb (64.4 kg)     Height 08/30/20 1046 5\' 7"  (1.702 m)     Head Circumference --      Peak Flow --      Pain Score 08/30/20 1046 10     Pain Loc --      Pain Edu? --      Excl. in GC? --     Constitutional: Alert and oriented. Well appearing and in no acute  distress. Eyes: Conjunctivae are normal. PERRL. EOMI. Head: Atraumatic. Nose: No congestion/rhinnorhea. Mouth/Throat: Mucous membranes are moist.  Oropharynx non-erythematous.  Fractured right lower molar and multiple devitalized teeth. Neck: No stridor.  Hematological/Lymphatic/Immunilogical: No cervical lymphadenopathy. Cardiovascular: Normal rate, regular rhythm. Grossly normal heart sounds.  Good peripheral circulation. Respiratory: Normal respiratory effort.  No retractions. Lungs CTAB.  ____________________________________________   LABS (all labs ordered are listed, but only abnormal results are displayed)  Labs Reviewed - No data to display ____________________________________________  EKG   ____________________________________________  RADIOLOGY I, 10/30/20, personally viewed and evaluated these images (plain radiographs) as part of my medical decision making, as well as reviewing the written report by the radiologist.  ED MD interpretation:    Official radiology report(s): No results found.  ____________________________________________   PROCEDURES  Procedure(s) performed (including Critical Care):  Procedures   ____________________________________________   INITIAL IMPRESSION / ASSESSMENT AND PLAN / ED COURSE  As part of my medical decision making, I reviewed the following data within the electronic MEDICAL RECORD NUMBER         Patient presents with chronic dental pain secondary to fractured tooth.  Patient given list of dental clinics for follow-up care.  Advised patient we can cannot continue to give pain medication for chronic condition.     ____________________________________________   FINAL CLINICAL IMPRESSION(S) / ED DIAGNOSES  Final diagnoses:  Closed fracture of tooth, initial encounter     ED Discharge Orders         Ordered    amoxicillin (AMOXIL) 875 MG tablet  2 times daily        08/30/20 1140    naproxen (NAPROSYN) 500 MG  tablet  2 times daily with meals        08/30/20 1140    lidocaine (XYLOCAINE) 2 % solution  Every 6 hours PRN        08/30/20 1140    oxyCODONE-acetaminophen (PERCOCET) 7.5-325 MG tablet  Every 6 hours PRN        08/30/20 1140           Note:  This document was prepared using Dragon voice recognition software and may include unintentional dictation errors.    10/30/20, PA-C 08/30/20 1146    10/30/20, MD 08/30/20 [phone]

## 2020-08-30 NOTE — ED Triage Notes (Signed)
C/O broken tooth to left lower jaw for a while, c/o pain to area x 2-3 days.

## 2020-09-09 ENCOUNTER — Encounter: Payer: Self-pay | Admitting: Emergency Medicine

## 2020-09-09 ENCOUNTER — Emergency Department
Admission: EM | Admit: 2020-09-09 | Discharge: 2020-09-09 | Disposition: A | Discharge status: Home / Self Care | Payer: 59 | Attending: Emergency Medicine | Admitting: Emergency Medicine

## 2020-09-09 ENCOUNTER — Other Ambulatory Visit: Payer: Self-pay

## 2020-09-09 DIAGNOSIS — K047 Periapical abscess without sinus: Secondary | ICD-10-CM | POA: Insufficient documentation

## 2020-09-09 MED ORDER — DOXYCYCLINE HYCLATE 100 MG PO TABS: 100.0000 mg | ORAL_TABLET | Freq: Two times a day (BID) | ORAL | 0 refills | Status: DC

## 2020-09-09 MED ORDER — LIDOCAINE-EPINEPHRINE 2 %-1:100000 IJ SOLN
1.7000 mL | Freq: Once | INTRAMUSCULAR | Status: AC
  Administered 2020-09-09: 1.7 mL
  Filled 2020-09-09: qty 1.7

## 2020-09-09 MED ORDER — OXYCODONE-ACETAMINOPHEN 5-325 MG PO TABS: 1.0000 | ORAL_TABLET | Freq: Four times a day (QID) | ORAL | 0 refills | Status: DC | PRN

## 2020-09-09 NOTE — ED Provider Notes (Signed)
[facility] Emergency Department Provider Note  ____________________________________________  Time seen: Approximately 4:29 PM  I have reviewed the triage vital signs and the nursing notes.   HISTORY  Chief Complaint Dental Pain    HPI Sharon Salazar is a 30 y.o. female who presents emergency department for recurrent dental infection.  Patient states that she has a broken tooth but does not want to go see a dentist for same.  She states that she keeps getting an area of infection along the right gumline.  This is in the lower dentition.  No fevers or chills, difficulty breathing or swallowing.  Patient has not tried any medication this around for symptoms prior to arrival.  Patient has been seen several times for similar complaints in the past.       History reviewed. No pertinent past medical history.  There are no problems to display for this patient.   History reviewed. No pertinent surgical history.  Prior to Admission medications   Medication Sig Start Date End Date Taking? Authorizing Provider  doxycycline (VIBRA-TABS) 100 MG tablet Take 1 tablet (100 mg total) by mouth 2 (two) times daily. 09/09/20  Yes Torrin Crihfield, Delorise Royals, PA-C  oxyCODONE-acetaminophen (PERCOCET/ROXICET) 5-325 MG tablet Take 1 tablet by mouth every 6 (six) hours as needed for severe pain. 09/09/20  Yes Kevaughn Ewing, Delorise Royals, PA-C  lidocaine (XYLOCAINE) 2 % solution Use as directed 5 mLs in the mouth or throat every 6 (six) hours as needed for mouth pain. 08/30/20   Joni Reining, PA-C  naproxen (NAPROSYN) 500 MG tablet Take 1 tablet (500 mg total) by mouth 2 (two) times daily with a meal. 08/30/20   Joni Reining, PA-C  oxyCODONE-acetaminophen (PERCOCET) 7.5-325 MG tablet Take 1 tablet by mouth every 6 (six) hours as needed for severe pain. 08/30/20   Joni Reining, PA-C    Allergies Shellfish allergy, Cranberry, and Other  No family history on file.  Social History Social  History   Tobacco Use   Smoking status: Never   Smokeless tobacco: Never  Substance Use Topics   Alcohol use: Not Currently     Review of Systems  Constitutional: No fever/chills Eyes: No visual changes. No discharge ENT: Positive for right lower dental pain  Cardiovascular: no chest pain. Respiratory: no cough. No SOB. Gastrointestinal: No abdominal pain.  No nausea, no vomiting.  No diarrhea.  No constipation. Musculoskeletal: Negative for musculoskeletal pain. Skin: Negative for rash, abrasions, lacerations, ecchymosis. Neurological: Negative for headaches, focal weakness or numbness.  10 System ROS otherwise negative.  ____________________________________________   PHYSICAL EXAM:  VITAL SIGNS: ED Triage Vitals [09/09/20 1405]  Enc Vitals Group     BP 111/70     Pulse Rate 83     Resp 18     Temp 98.6 F (37 C)     Temp Source Oral     SpO2      Weight 141 lb 15.6 oz (64.4 kg)     Height 5\' 7"  (1.702 m)     Head Circumference      Peak Flow      Pain Score 10     Pain Loc      Pain Edu?      Excl. in GC?      Constitutional: Alert and oriented. Well appearing and in no acute distress. Eyes: Conjunctivae are normal. PERRL. EOMI. Head: Atraumatic. ENT:      Ears:       Nose: No congestion/rhinnorhea.  Mouth/Throat: Mucous membranes are moist.  Visualization of the oropharyngeal cavity reveals multiple areas of erosion and missing dentition.  Patient has a fractured tooth #28.  Patient has a soft tissue lesion between tooth 28 and 30 along the lateral gumline with what appears to be a small pocket of pus.  This is not draining at this time.  No other gross signs of infection in the oropharyngeal cavity. Neck: No stridor.    Cardiovascular: Normal rate, regular rhythm. Normal S1 and S2.  Good peripheral circulation. Respiratory: Normal respiratory effort without tachypnea or retractions. Lungs CTAB. Good air entry to the bases with no decreased or absent  breath sounds. Musculoskeletal: Full range of motion to all extremities. No gross deformities appreciated. Neurologic:  Normal speech and language. No gross focal neurologic deficits are appreciated.  Skin:  Skin is warm, dry and intact. No rash noted. Psychiatric: Mood and affect are normal. Speech and behavior are normal. Patient exhibits appropriate insight and judgement.   ____________________________________________   LABS (all labs ordered are listed, but only abnormal results are displayed)  Labs Reviewed - No data to display ____________________________________________  EKG   ____________________________________________  RADIOLOGY   No results found.  ____________________________________________    PROCEDURES  Procedure(s) performed:    Marland KitchenMarland KitchenIncision and Drainage  Date/Time: 09/09/2020 7:22 PM Performed by: Racheal Patches, PA-C Authorized by: Racheal Patches, PA-C   Consent:    Consent obtained:  Verbal   Consent given by:  Patient   Risks discussed:  Bleeding, incomplete drainage and pain   Alternatives discussed:  No treatment Universal protocol:    Procedure explained and questions answered to patient or proxy's satisfaction: yes     Patient identity confirmed:  Verbally with patient Location:    Type:  Abscess   Location:  Mouth   Mouth location: R gum line between tooth 28 and tooth 30. Anesthesia:    Anesthesia method:  Nerve block   Block location:  Inferior alveolar nerve   Block needle gauge:  27 G   Block anesthetic:  Lidocaine 1% WITH epi   Block technique:  Inferior alveolar nerve block   Block injection procedure:  Anatomic landmarks identified, introduced needle, negative aspiration for blood and incremental injection   Block outcome:  Anesthesia achieved Procedure type:    Complexity:  Simple Procedure details:    Ultrasound guidance: no     Incision types:  Stab incision   Drainage:  Bloody and purulent   Drainage  amount:  Scant   Packing materials:  None Post-procedure details:    Procedure completion:  Tolerated well, no immediate complications    Medications  lidocaine-EPINEPHrine (XYLOCAINE W/EPI) 2 %-1:100000 (with pres) injection 1.7 mL (1.7 mLs Infiltration Given by Other 09/09/20 1625)  lidocaine-EPINEPHrine (XYLOCAINE W/EPI) 2 %-1:100000 (with pres) injection 1.7 mL (1.7 mLs Infiltration Given by Other 09/09/20 1625)     ____________________________________________   INITIAL IMPRESSION / ASSESSMENT AND PLAN / ED COURSE  Pertinent labs & imaging results that were available during my care of the patient were reviewed by me and considered in my medical decision making (see chart for details).  Review of the [place] CSRS was performed in accordance of the [facility] prior to dispensing any controlled drugs.           Patient's diagnosis is consistent with dental infection.  Patient presented to the emergency department complaining of an infection along the right lower dentition.  Patient had multiple broken teeth and missing teeth on  exam.  Patient states that she does not have any interest in following up with a dentist but keeps having recurring infections to this area.  Patient did have an appreciable lesion on the right lower gumline that was drained here in the emergency department.  Patient was given an inferior alveolar nerve block with needle drainage of this lesion.  Patient tolerated well.  Patient will be placed on antibiotics and encouraged to follow-up with a dentist. Patient is given ED precautions to return to the ED for any worsening or new symptoms.     ____________________________________________  FINAL CLINICAL IMPRESSION(S) / ED DIAGNOSES  Final diagnoses:  Dental abscess      NEW MEDICATIONS STARTED DURING THIS VISIT:  ED Discharge Orders          Ordered    doxycycline (VIBRA-TABS) 100 MG tablet  2 times daily        09/09/20 1631    oxyCODONE-acetaminophen  (PERCOCET/ROXICET) 5-325 MG tablet  Every 6 hours PRN        09/09/20 1631                This chart was dictated using voice recognition software/Dragon. Despite best efforts to proofread, errors can occur which can change the meaning. Any change was purely unintentional.    Racheal Patches, PA-C 09/09/20 Raiford Noble, MD 09/09/20 [phone]

## 2020-09-09 NOTE — ED Triage Notes (Signed)
Pt c/o lower left tooth pain for over a week. Pt states she had antibiotics but the pain has not got any better.

## 2020-09-09 NOTE — ED Notes (Addendum)
See triage note  Was seen for dental pain  Was placed on meds  Cont's to have pain states she felt like the area could be drained  States noticed some drainage

## 2020-09-09 NOTE — Discharge Instructions (Addendum)
OPTIONS FOR DENTAL FOLLOW UP CARE ° °[place] Department of Health and Human Services - Local Safety Net Dental Clinics °http://www.ncdhhs.gov/dph/oralhealth/services/safetynetclinics.htm °  °[facility] (336-562-3123) ° °[facility] [place] (919-933-9087) ° °[facility] ([phone] ext [phone]) ° ° [facility] ([phone]) ° °[facility] ([phone]) °This clinic caters to the indigent population and is on a lottery system. °Location: °[facility], [address], [place] °Clinic Hours: °Wednesdays from 6pm - 9pm, patients seen by a lottery system. °For dates, call or go to [email or web address]/[facility]/patients/Dental-[facility] °Services: °Cleanings, fillings and simple extractions. °Payment Options: °DENTAL WORK IS FREE OF CHARGE. Bring proof of income or support. °Best way to get seen: °Arrive at 5:15 pm - this is a lottery, NOT first come/first serve, so arriving earlier will not increase your chances of being seen. °  °  °[facility] °[phone] °Select option 1 for emergencies °  °Location: °[facility], [address], [place] °Clinic Hours: °No walk-ins accepted - call the day before to schedule an appointment. °Check in times are 9:30 am and 1:30 pm. °Services: °Simple extractions, temporary fillings, pulpectomy/pulp debridement, uncomplicated abscess drainage. °Payment Options: °PAYMENT IS DUE AT THE TIME OF SERVICE.  Fee is usually $100-200, additional surgical procedures (e.g. abscess drainage) may be extra. °Cash, checks, Visa/MasterCard accepted.  Can file Medicaid if patient is covered for dental - patient should call case worker to check. °No discount for [facility] patients. °Best way to get seen: °MUST call the day before and get onto the schedule. Can usually be seen the next 1-2 days. No walk-ins accepted. °  °  °[facility] °[phone] °   °Location: °[facility], [address], [place] °Clinic Hours: °M, W, Th, F 8am or 1:30pm, Tues 9a or 1:30 - first come/first served. °Services: °Simple extractions, temporary fillings, uncomplicated abscess drainage.  You do not need to be an [place] resident. °Payment Options: °PAYMENT IS DUE AT THE TIME OF SERVICE. °Dental insurance, otherwise sliding scale - bring proof of income or support. °Depending on income and treatment needed, cost is usually $50-200. °Best way to get seen: °Arrive early as it is first come/first served. °  °  °[facility] °[phone] °  °Location: °[address] °Clinic Hours: °Mon-Thu 8a-5p °Services: °Most basic dental services including extractions and fillings. °Payment Options: °PAYMENT IS DUE AT THE TIME OF SERVICE. °Sliding scale, up to 50% off - bring proof if income or support. °Medicaid with dental option accepted. °Best way to get seen: °Call to schedule an appointment, can usually be seen within 2 weeks OR they will try to see walk-ins - show up at 8a or 2p (you may have to wait). °  °  °[facility] °919-245-2435 °[place] RESIDENTS ONLY °  °Location: °[facility], [address], [place], [place] [address] °Clinic Hours: By appointment only. °Monday - Thursday 8am-5pm, Friday 8am-12pm °Services: Cleanings, fillings, extractions. °Payment Options: °PAYMENT IS DUE AT THE TIME OF SERVICE. °Cash, Visa or MasterCard. Sliding scale - $30 minimum per service. °Best way to get seen: °Come in to office, complete packet and make an appointment - need proof of income °or support monies for each household member and proof of [place] residence. °Usually takes about a month to get in. °  °  °[facility] °[phone] °  °Location: °[address]   [place] °Clinic Hours: Walk-in Urgent Care Dental Services are offered Monday-Friday  mornings only. °The numbers of emergencies accepted daily is limited to the number of °providers available. °Maximum 15 - Mondays, Wednesdays & Thursdays °Maximum 10 - Tuesdays & Fridays °Services: °You do not need to be a  [place] resident to be seen for a dental emergency. °Emergencies are defined as pain, swelling, abnormal bleeding, or dental trauma. Walkins will receive x-rays if needed. °NOTE: Dental cleaning is not an emergency. °Payment Options: °PAYMENT IS DUE AT THE TIME OF SERVICE. °Minimum co-pay is $40.00 for uninsured patients. °Minimum co-pay is $3.00 for Medicaid with dental coverage. °Dental Insurance is accepted and must be presented at time of visit. °Medicare does not cover dental. °Forms of payment: Cash, credit card, checks. °Best way to get seen: °If not previously registered with the clinic, walk-in dental registration begins at 7:15 am and is on a first come/first serve basis. °If previously registered with the clinic, call to make an appointment. °  °  °[facility] °[phone] °[place] RESIDENTS ONLY °  °Location: °[address], [place], [place] °Clinic Hours: °Mon-Thu 10a-2p °Services: Extractions only! °Payment Options: °FREE (donations accepted) - bring proof of income or support °Best way to get seen: °Call and schedule an appointment OR come at 8am on the 1st Monday of every month (except for holidays) when it is first come/first served. °  °  °[facility] °[phone] °  °Location: °[address], [place] °Clinic Hours: °Friday mornings °Services, Payment Options, Best way to get seen: °Call for info °

## 2020-10-01 ENCOUNTER — Encounter: Payer: Self-pay | Admitting: Oncology

## 2020-10-01 ENCOUNTER — Inpatient Hospital Stay: Payer: Medicaid Other

## 2020-10-01 ENCOUNTER — Other Ambulatory Visit: Payer: Self-pay

## 2020-10-01 ENCOUNTER — Inpatient Hospital Stay: Payer: Medicaid Other | Attending: Oncology | Admitting: Oncology

## 2020-10-01 VITALS — BP 101/63 | Temp 96.5°F | Resp 18 | Ht 67.0 in | Wt 136.4 lb

## 2020-10-01 DIAGNOSIS — D509 Iron deficiency anemia, unspecified: Secondary | ICD-10-CM | POA: Insufficient documentation

## 2020-10-01 DIAGNOSIS — R1013 Epigastric pain: Secondary | ICD-10-CM | POA: Insufficient documentation

## 2020-10-01 LAB — IRON AND TIBC
Iron: 30 ug/dL (ref 28–170)
Saturation Ratios: 7 % — ABNORMAL LOW (ref 10.4–31.8)
TIBC: 428 ug/dL (ref 250–450)
UIBC: 398 ug/dL

## 2020-10-01 LAB — CBC WITH DIFFERENTIAL/PLATELET
Abs Immature Granulocytes: 0 10*3/uL (ref 0.00–0.07)
Basophils Absolute: 0 10*3/uL (ref 0.0–0.1)
Basophils Relative: 1 %
Eosinophils Absolute: 0.1 10*3/uL (ref 0.0–0.5)
Eosinophils Relative: 1 %
HCT: 34.6 % — ABNORMAL LOW (ref 36.0–46.0)
Hemoglobin: 10.8 g/dL — ABNORMAL LOW (ref 12.0–15.0)
Immature Granulocytes: 0 %
Lymphocytes Relative: 29 %
Lymphs Abs: 1.2 10*3/uL (ref 0.7–4.0)
MCH: 28.1 pg (ref 26.0–34.0)
MCHC: 31.2 g/dL (ref 30.0–36.0)
MCV: 90.1 fL (ref 80.0–100.0)
Monocytes Absolute: 0.4 10*3/uL (ref 0.1–1.0)
Monocytes Relative: 9 %
Neutro Abs: 2.5 10*3/uL (ref 1.7–7.7)
Neutrophils Relative %: 60 %
Platelets: 236 10*3/uL (ref 150–400)
RBC: 3.84 MIL/uL — ABNORMAL LOW (ref 3.87–5.11)
RDW: 15.9 % — ABNORMAL HIGH (ref 11.5–15.5)
WBC: 4.1 10*3/uL (ref 4.0–10.5)
nRBC: 0 % (ref 0.0–0.2)

## 2020-10-01 LAB — COMPREHENSIVE METABOLIC PANEL
ALT: 13 U/L (ref 0–44)
AST: 22 U/L (ref 15–41)
Albumin: 3.9 g/dL (ref 3.5–5.0)
Alkaline Phosphatase: 47 U/L (ref 38–126)
Anion gap: 5 (ref 5–15)
BUN: 9 mg/dL (ref 6–20)
CO2: 27 mmol/L (ref 22–32)
Calcium: 9 mg/dL (ref 8.9–10.3)
Chloride: 104 mmol/L (ref 98–111)
Creatinine, Ser: 0.65 mg/dL (ref 0.44–1.00)
GFR, Estimated: >60 mL/min (ref >60)
Glucose, Bld: 93 mg/dL (ref 70–99)
Potassium: 4.3 mmol/L (ref 3.5–5.1)
Sodium: 136 mmol/L (ref 135–145)
Total Bilirubin: 0.6 mg/dL (ref 0.3–1.2)
Total Protein: 7 g/dL (ref 6.5–8.1)

## 2020-10-01 LAB — TECHNOLOGIST SMEAR REVIEW
Plt Morphology: NORMAL
RBC MORPHOLOGY: NORMAL
WBC MORPHOLOGY: NORMAL

## 2020-10-01 LAB — FERRITIN: Ferritin: 3 ng/mL — ABNORMAL LOW (ref 11–307)

## 2020-10-01 LAB — TSH: TSH: 1.429 u[IU]/mL (ref 0.350–4.500)

## 2020-10-01 NOTE — Progress Notes (Signed)
Patient here to establish care for anemia 

## 2020-10-01 NOTE — Progress Notes (Signed)
Hematology/Oncology Consult note [facility] [phone] [phone]   Patient Care Team: Patient, No Pcp Per (Inactive) as PCP - General (General Practice)  REFERRING PROVIDER: Leonel Ramsay, MD  CHIEF COMPLAINTS/REASON FOR VISIT:  Evaluation of  anemia  HISTORY OF PRESENTING ILLNESS:   Sharon Salazar is a  30 y.o.  female with PMH listed below was seen in consultation at the request of  Leonel Ramsay, MD  for evaluation of anemia  Patient has chronic anemia.  She reports previous history of receiving either blood transfusion or iron infusion back in 2015 after she cut her left arm accidentally. She takes oral iron supplementation intermittently. Denies any bloody or tarry stool.  She reports that her menstrual period has been light.  Usually lasts 4 to 5 days.  On the heaviest day, she only uses 1 or 2 pads during the day. She describes some epigastric "folding discomfort" after eating. Per patient, she feels extremely hungry after eating.  Her primary care provider Dr. Ola Spurr plans to check parasites.  She was informed that the test kit will arrive in the mail.  Other chronic medical problems include psoriasis with chronic arthralgia and myalgia.  Depression/anxiety, asthma.  She has been advised to take meloxicam as needed for inflammation and pain.  On Zoloft for mood disorder.  Trazodone for sleep.  Very fatigued and tired.  She craves ice chips.  She lives with her girlfriend.  She is sexually active Review of Systems  Constitutional:  Positive for fatigue. Negative for appetite change, chills and fever.  HENT:   Negative for hearing loss and voice change.   Eyes:  Negative for eye problems.  Respiratory:  Negative for chest tightness and cough.   Cardiovascular:  Negative for chest pain.  Gastrointestinal:  Negative for abdominal distention, abdominal pain and blood in stool.  Endocrine: Negative for hot flashes.   Genitourinary:  Negative for difficulty urinating and frequency.   Musculoskeletal:  Positive for arthralgias.  Skin:  Negative for itching and rash.  Neurological:  Negative for extremity weakness.  Hematological:  Negative for adenopathy.  Psychiatric/Behavioral:  Negative for confusion.    MEDICAL HISTORY:  Past Medical History:  Diagnosis Date   Anemia    Anxiety    Asthma     SURGICAL HISTORY: Past Surgical History:  Procedure Laterality Date   left arm surgery Left    tonsillectcomy      SOCIAL HISTORY: Social History   Socioeconomic History   Marital status: Single    Spouse name: Not on file   Number of children: Not on file   Years of education: Not on file   Highest education level: Not on file  Occupational History   Not on file  Tobacco Use   Smoking status: Never   Smokeless tobacco: Never  Vaping Use   Vaping Use: Never used  Substance and Sexual Activity   Alcohol use: Not Currently    Comment: occasional   Drug use: Yes    Types: Marijuana   Sexual activity: Not on file  Other Topics Concern   Not on file  Social History Narrative   Not on file   Social Determinants of Health   Financial Resource Strain: Not on file  Food Insecurity: Not on file  Transportation Needs: Not on file  Physical Activity: Not on file  Stress: Not on file  Social Connections: Not on file  Intimate Partner Violence: Not on file    FAMILY  HISTORY: Family History  Problem Relation Age of Onset   Anemia Mother    Sarcoidosis Maternal Grandmother     ALLERGIES:  is allergic to shellfish allergy, cranberry, and other.  MEDICATIONS:  Current Outpatient Medications  Medication Sig Dispense Refill   doxycycline (VIBRA-TABS) 100 MG tablet Take 1 tablet (100 mg total) by mouth 2 (two) times daily. 14 tablet 0   lidocaine (XYLOCAINE) 2 % solution Use as directed 5 mLs in the mouth or throat every 6 (six) hours as needed for mouth pain. (Patient not taking:  Reported on 10/01/2020) 100 mL 0   naproxen (NAPROSYN) 500 MG tablet Take 1 tablet (500 mg total) by mouth 2 (two) times daily with a meal. (Patient not taking: Reported on 10/01/2020) 20 tablet 00   oxyCODONE-acetaminophen (PERCOCET) 7.5-325 MG tablet Take 1 tablet by mouth every 6 (six) hours as needed for severe pain. (Patient not taking: Reported on 10/01/2020) 12 tablet 0   oxyCODONE-acetaminophen (PERCOCET/ROXICET) 5-325 MG tablet Take 1 tablet by mouth every 6 (six) hours as needed for severe pain. (Patient not taking: Reported on 10/01/2020) 10 tablet 0   sertraline (ZOLOFT) 25 MG tablet Take 25 mg by mouth daily. (Patient not taking: Reported on 10/01/2020)     traZODone (DESYREL) 50 MG tablet Take by mouth. (Patient not taking: Reported on 10/01/2020)     No current facility-administered medications for this visit.     PHYSICAL EXAMINATION: ECOG PERFORMANCE STATUS: 1 - Symptomatic but completely ambulatory Vitals:   10/01/20 1118  BP: 101/63  Resp: 18  Temp: (!) 96.5 F (35.8 C)   Filed Weights   10/01/20 1118  Weight: 136 lb 6.4 oz (61.9 kg)    Physical Exam Constitutional:      General: She is not in acute distress. HENT:     Head: Normocephalic and atraumatic.  Eyes:     General: No scleral icterus. Cardiovascular:     Rate and Rhythm: Normal rate and regular rhythm.     Heart sounds: Normal heart sounds.  Pulmonary:     Effort: Pulmonary effort is normal. No respiratory distress.     Breath sounds: No wheezing.  Abdominal:     General: Bowel sounds are normal. There is no distension.     Palpations: Abdomen is soft.  Musculoskeletal:        General: No deformity. Normal range of motion.     Cervical back: Normal range of motion and neck supple.  Skin:    General: Skin is warm and dry.     Findings: No erythema or rash.  Neurological:     Mental Status: She is alert and oriented to person, place, and time. Mental status is at baseline.     Cranial Nerves: No cranial  nerve deficit.     Coordination: Coordination normal.  Psychiatric:        Mood and Affect: Mood normal.    LABORATORY DATA:  I have reviewed the data as listed Lab Results  Component Value Date   WBC 4.1 10/01/2020   HGB 10.8 (L) 10/01/2020   HCT 34.6 (L) 10/01/2020   MCV 90.1 10/01/2020   PLT 236 10/01/2020   Recent Labs    10/01/20 1150  NA 136  K 4.3  CL 104  CO2 27  GLUCOSE 93  BUN 9  CREATININE 0.65  CALCIUM 9.0  GFRNONAA >60  PROT 7.0  ALBUMIN 3.9  AST 22  ALT 13  ALKPHOS 47  BILITOT 0.6  Iron/TIBC/Ferritin/ %Sat    Component Value Date/Time   IRON 30 10/01/2020 1150   TIBC 428 10/01/2020 1150   FERRITIN 3 (L) 10/01/2020 1150   IRONPCTSAT 7 (L) 10/01/2020 1150      RADIOGRAPHIC STUDIES: I have personally reviewed the radiological images as listed and agreed with the findings in the report. No results found.     ASSESSMENT & PLAN:  1. Iron deficiency anemia, unspecified iron deficiency anemia type   2. Epigastric discomfort    I reviewed her labs done recently at her primary care provider's office.  Ferritin was 2, iron TIBC ferritin were not checked yet.  H&H showed a hemoglobin of 10.5 with a hematocrit of 34.3. This is consistent with iron deficiency anemia Recommend to recheck CBC, check a full iron panel with ferritin, check CMP, TSH, blood smear, celiac panel 10 Discussed with patient about option of IV Venofer treatments. Plan IV iron with Venofer 256m weekly x 4 doses. Allergy reactions/infusion reaction including anaphylactic reaction discussed with patient. Other side effects include but not limited to high blood pressure, skin rash, weight gain, leg swelling, etc. Patient voices understanding and willing to proceed. She has a history of anaphylactic reaction. Per patient she was hospitalized for close monitoring.  No intubation or CPR needed. Patient takes UMelburn Poppertaxis to appointments.  I will give patient premed off Benadryl 25 mg prior  to each Venofer treatments.  She agrees with the plan.  #Epigastric discomfort. Etiology unknown.  Etiology for iron deficiency is also unclear.  Recommend patient to further discuss with Dr. FOla Spurrto see if she may see gastroenterology for further work-up.   Orders Placed This Encounter  Procedures   Iron and TIBC    Standing Status:   Future    Number of Occurrences:   1    Standing Expiration Date:   10/01/2021   Ferritin    Standing Status:   Future    Number of Occurrences:   1    Standing Expiration Date:   04/03/2021   CBC with Differential/Platelet    Standing Status:   Future    Number of Occurrences:   1    Standing Expiration Date:   10/01/2021   Technologist smear review    Standing Status:   Future    Number of Occurrences:   1    Standing Expiration Date:   10/01/2021   Comprehensive metabolic panel    Standing Status:   Future    Number of Occurrences:   1    Standing Expiration Date:   10/01/2021   TSH    Standing Status:   Future    Number of Occurrences:   1    Standing Expiration Date:   10/01/2021   Celiac panel 10    All questions were answered. The patient knows to call the clinic with any problems questions or concerns.  cc FLeonel Ramsay MD    Return of visit: Venofer weekly x4  8 weeks, labs prior, virtual MD, +/- Venofer. Thank you for this kind referral and the opportunity to participate in the care of this patient. A copy of today's note is routed to referring provider    ZEarlie Server MD, PhD Hematology Oncology CSan [facility] State Hospitalat APrescott Urocenter LtdPager- [phone]/08/2020

## 2020-10-02 ENCOUNTER — Other Ambulatory Visit: Payer: Self-pay

## 2020-10-02 ENCOUNTER — Telehealth: Payer: Self-pay

## 2020-10-02 DIAGNOSIS — D509 Iron deficiency anemia, unspecified: Secondary | ICD-10-CM

## 2020-10-02 NOTE — Telephone Encounter (Signed)
Please schedule patient for lab/Venofer weekly x4. (Lab for urine pregancy test).   Labs in 3 months/ Mychart visit/ poss venofer and please notify pt of appts.

## 2020-10-02 NOTE — Telephone Encounter (Signed)
-----   Message from [name], MD sent at 10/01/2020  4:02 PM EDT ----- Please arrange her to get weekly venofer x 4.  Lab in 3 months, ordered, prior to virtual MD + venofer

## 2020-10-06 LAB — CELIAC PANEL 10
Antigliadin Abs, IgA: 6 units (ref 0–19)
Endomysial Ab, IgA: NEGATIVE
Gliadin IgG: 1 units (ref 0–19)
IgA: 174 mg/dL (ref 87–352)
Tissue Transglut Ab: 2 U/mL (ref 0–5)
Tissue Transglutaminase Ab, IgA: <2 U/mL (ref 0–3)

## 2020-10-09 ENCOUNTER — Inpatient Hospital Stay: Payer: Medicaid Other

## 2020-10-09 ENCOUNTER — Ambulatory Visit: Payer: Medicaid Other

## 2020-10-09 ENCOUNTER — Other Ambulatory Visit: Payer: Medicaid Other

## 2020-10-13 ENCOUNTER — Inpatient Hospital Stay: Payer: Medicaid Other

## 2020-10-13 ENCOUNTER — Other Ambulatory Visit: Payer: Self-pay

## 2020-10-13 VITALS — BP 101/58 | HR 58 | Temp 97.0°F | Resp 18

## 2020-10-13 DIAGNOSIS — D509 Iron deficiency anemia, unspecified: Secondary | ICD-10-CM

## 2020-10-13 LAB — PREGNANCY, URINE: Preg Test, Ur: NEGATIVE

## 2020-10-13 MED ORDER — IRON SUCROSE 20 MG/ML IV SOLN
200.0000 mg | Freq: Once | INTRAVENOUS | Status: AC
  Administered 2020-10-13: 200 mg via INTRAVENOUS
  Filled 2020-10-13: qty 10

## 2020-10-13 MED ORDER — SODIUM CHLORIDE 0.9 % IV SOLN: 200.0000 mg | Freq: Once | INTRAVENOUS | Status: DC

## 2020-10-13 MED ORDER — SODIUM CHLORIDE 0.9 % IV SOLN
Freq: Once | INTRAVENOUS | Status: AC
  Filled 2020-10-13: qty 250

## 2020-10-13 NOTE — Patient Instructions (Signed)

## 2020-10-13 NOTE — Progress Notes (Signed)
pt reports a intermittent pain described as tingling in left thigh that started 2 days ago. Per Dr. Cathie Hoops, Pt to call PCP if pain does not improve with Venofer infusions, pt aware and verbalizes understanding. Pt stable at discharge.

## 2020-10-16 ENCOUNTER — Inpatient Hospital Stay: Payer: Medicaid Other

## 2020-10-16 ENCOUNTER — Ambulatory Visit: Payer: Medicaid Other

## 2020-10-16 ENCOUNTER — Other Ambulatory Visit: Payer: Medicaid Other

## 2020-10-17 NOTE — Progress Notes (Signed)
The following Medication: Venofer is approved for drug replacement program by [employer]. The enrollment period is from 10/16/2020 to 10/16/2021.  Reason for Assistance: Self. ID: [id number] First DOS:10/13/2020.   Virgina Jock, CPhT IV Drug Replacement Specialist  [facility] Phone: [phone]

## 2020-10-20 ENCOUNTER — Other Ambulatory Visit: Payer: Self-pay

## 2020-10-20 ENCOUNTER — Inpatient Hospital Stay: Payer: Medicaid Other

## 2020-10-20 DIAGNOSIS — D509 Iron deficiency anemia, unspecified: Secondary | ICD-10-CM

## 2020-10-20 LAB — PREGNANCY, URINE: Preg Test, Ur: NEGATIVE

## 2020-10-20 MED ORDER — SODIUM CHLORIDE 0.9 % IV SOLN: 200.0000 mg | Freq: Once | INTRAVENOUS | Status: DC

## 2020-10-20 MED ORDER — SODIUM CHLORIDE 0.9 % IV SOLN
Freq: Once | INTRAVENOUS | Status: AC
  Filled 2020-10-20: qty 250

## 2020-10-20 MED ORDER — IRON SUCROSE 20 MG/ML IV SOLN
200.0000 mg | Freq: Once | INTRAVENOUS | Status: AC
  Administered 2020-10-20: 200 mg via INTRAVENOUS
  Filled 2020-10-20: qty 10

## 2020-10-23 ENCOUNTER — Other Ambulatory Visit: Payer: Medicaid Other

## 2020-10-23 ENCOUNTER — Inpatient Hospital Stay: Payer: Medicaid Other

## 2020-10-23 ENCOUNTER — Ambulatory Visit: Payer: Medicaid Other

## 2020-10-24 ENCOUNTER — Encounter: Payer: Self-pay | Admitting: Oncology

## 2020-10-24 NOTE — Telephone Encounter (Signed)
Please advise 

## 2020-10-27 ENCOUNTER — Inpatient Hospital Stay: Payer: Medicaid Other | Attending: Oncology

## 2020-10-27 ENCOUNTER — Inpatient Hospital Stay: Payer: Medicaid Other

## 2020-10-27 VITALS — BP 113/68 | HR 60 | Temp 97.0°F | Resp 17

## 2020-10-27 DIAGNOSIS — D509 Iron deficiency anemia, unspecified: Secondary | ICD-10-CM

## 2020-10-27 DIAGNOSIS — Z3202 Encounter for pregnancy test, result negative: Secondary | ICD-10-CM | POA: Insufficient documentation

## 2020-10-27 LAB — PREGNANCY, URINE: Preg Test, Ur: NEGATIVE

## 2020-10-27 MED ORDER — SODIUM CHLORIDE 0.9 % IV SOLN
Freq: Once | INTRAVENOUS | Status: AC
  Filled 2020-10-27: qty 250

## 2020-10-27 MED ORDER — IRON SUCROSE 20 MG/ML IV SOLN
200.0000 mg | Freq: Once | INTRAVENOUS | Status: AC
  Administered 2020-10-27: 200 mg via INTRAVENOUS
  Filled 2020-10-27: qty 10

## 2020-10-27 MED ORDER — SODIUM CHLORIDE 0.9 % IV SOLN: 200.0000 mg | Freq: Once | INTRAVENOUS | Status: DC

## 2020-10-27 NOTE — Patient Instructions (Signed)
[facility] [place] [facility]  Discharge Instructions: Thank you for choosing [facility] to provide your oncology and hematology care.  If you have a lab appointment with the Cancer Center, please go directly to the Cancer Center and check in at the registration area.  Wear comfortable clothing and clothing appropriate for easy access to any Portacath or PICC line.   We strive to give you quality time with your provider. You may need to reschedule your appointment if you arrive late (15 or more minutes).  Arriving late affects you and other patients whose appointments are after yours.  Also, if you miss three or more appointments without notifying the office, you may be dismissed from the clinic at the provider's discretion.      For prescription refill requests, have your pharmacy contact our office and allow 72 hours for refills to be completed.    Today you received the following chemotherapy and/or immunotherapy agents venofer   To help prevent nausea and vomiting after your treatment, we encourage you to take your nausea medication as directed.  BELOW ARE SYMPTOMS THAT SHOULD BE REPORTED IMMEDIATELY: *FEVER GREATER THAN 100.4 F (38 C) OR HIGHER *CHILLS OR SWEATING *NAUSEA AND VOMITING THAT IS NOT CONTROLLED WITH YOUR NAUSEA MEDICATION *UNUSUAL SHORTNESS OF BREATH *UNUSUAL BRUISING OR BLEEDING *URINARY PROBLEMS (pain or burning when urinating, or frequent urination) *BOWEL PROBLEMS (unusual diarrhea, constipation, pain near the anus) TENDERNESS IN MOUTH AND THROAT WITH OR WITHOUT PRESENCE OF ULCERS (sore throat, sores in mouth, or a toothache) UNUSUAL RASH, SWELLING OR PAIN  UNUSUAL VAGINAL DISCHARGE OR ITCHING   Items with * indicate a potential emergency and should be followed up as soon as possible or go to the Emergency Department if any problems should occur.  Please show the CHEMOTHERAPY ALERT CARD or IMMUNOTHERAPY ALERT CARD at check-in to the  Emergency Department and triage nurse.  Should you have questions after your visit or need to cancel or reschedule your appointment, please contact [facility] [place] [facility]  [phone] and follow the prompts.  Office hours are 8:00 a.m. to 4:30 p.m. Monday - Friday. Please note that voicemails left after 4:00 p.m. may not be returned until the following business day.  We are closed weekends and major holidays. You have access to a nurse at all times for urgent questions. Please call the main number to the clinic [phone] and follow the prompts.  For any non-urgent questions, you may also contact your provider using MyChart. We now offer e-Visits for anyone 18 and older to request care online for non-urgent symptoms. For details visit [email or web address].   Also download the MyChart app! Go to the app store, search "MyChart", open the app, select [facility], and log in with your MyChart username and password.  Due to Covid, a mask is required upon entering the hospital/clinic. If you do not have a mask, one will be given to you upon arrival. For doctor visits, patients may have 1 support person aged 18 or older with them. For treatment visits, patients cannot have anyone with them due to current Covid guidelines and our immunocompromised population.  

## 2020-10-30 ENCOUNTER — Other Ambulatory Visit: Payer: Medicaid Other

## 2020-10-30 ENCOUNTER — Ambulatory Visit: Payer: Medicaid Other

## 2020-10-30 ENCOUNTER — Inpatient Hospital Stay: Payer: Medicaid Other

## 2020-11-03 ENCOUNTER — Inpatient Hospital Stay: Payer: Medicaid Other

## 2020-11-03 ENCOUNTER — Other Ambulatory Visit: Payer: Self-pay

## 2020-11-03 VITALS — BP 98/67 | HR 56 | Temp 96.4°F | Resp 19

## 2020-11-03 DIAGNOSIS — R1013 Epigastric pain: Secondary | ICD-10-CM

## 2020-11-03 DIAGNOSIS — D509 Iron deficiency anemia, unspecified: Secondary | ICD-10-CM

## 2020-11-03 LAB — PREGNANCY, URINE: Preg Test, Ur: NEGATIVE

## 2020-11-03 MED ORDER — IRON SUCROSE 20 MG/ML IV SOLN
200.0000 mg | Freq: Once | INTRAVENOUS | Status: AC
  Administered 2020-11-03: 200 mg via INTRAVENOUS
  Filled 2020-11-03: qty 10

## 2020-11-03 MED ORDER — SODIUM CHLORIDE 0.9 % IV SOLN
Freq: Once | INTRAVENOUS | Status: AC
  Filled 2020-11-03: qty 250

## 2020-11-03 MED ORDER — SODIUM CHLORIDE 0.9 % IV SOLN: 200.0000 mg | Freq: Once | INTRAVENOUS | Status: DC

## 2020-11-03 NOTE — Patient Instructions (Signed)
[facility] [place] [facility]  Discharge Instructions: Thank you for choosing [facility] to provide your oncology and hematology care.  If you have a lab appointment with the Cancer Center, please go directly to the Cancer Center and check in at the registration area.  Wear comfortable clothing and clothing appropriate for easy access to any Portacath or PICC line.   We strive to give you quality time with your provider. You may need to reschedule your appointment if you arrive late (15 or more minutes).  Arriving late affects you and other patients whose appointments are after yours.  Also, if you miss three or more appointments without notifying the office, you may be dismissed from the clinic at the provider's discretion.      For prescription refill requests, have your pharmacy contact our office and allow 72 hours for refills to be completed.    Today you received the following chemotherapy and/or immunotherapy agents venofer   To help prevent nausea and vomiting after your treatment, we encourage you to take your nausea medication as directed.  BELOW ARE SYMPTOMS THAT SHOULD BE REPORTED IMMEDIATELY: *FEVER GREATER THAN 100.4 F (38 C) OR HIGHER *CHILLS OR SWEATING *NAUSEA AND VOMITING THAT IS NOT CONTROLLED WITH YOUR NAUSEA MEDICATION *UNUSUAL SHORTNESS OF BREATH *UNUSUAL BRUISING OR BLEEDING *URINARY PROBLEMS (pain or burning when urinating, or frequent urination) *BOWEL PROBLEMS (unusual diarrhea, constipation, pain near the anus) TENDERNESS IN MOUTH AND THROAT WITH OR WITHOUT PRESENCE OF ULCERS (sore throat, sores in mouth, or a toothache) UNUSUAL RASH, SWELLING OR PAIN  UNUSUAL VAGINAL DISCHARGE OR ITCHING   Items with * indicate a potential emergency and should be followed up as soon as possible or go to the Emergency Department if any problems should occur.  Please show the CHEMOTHERAPY ALERT CARD or IMMUNOTHERAPY ALERT CARD at check-in to the  Emergency Department and triage nurse.  Should you have questions after your visit or need to cancel or reschedule your appointment, please contact [facility] [place] [facility]  [phone] and follow the prompts.  Office hours are 8:00 a.m. to 4:30 p.m. Monday - Friday. Please note that voicemails left after 4:00 p.m. may not be returned until the following business day.  We are closed weekends and major holidays. You have access to a nurse at all times for urgent questions. Please call the main number to the clinic [phone] and follow the prompts.  For any non-urgent questions, you may also contact your provider using MyChart. We now offer e-Visits for anyone 18 and older to request care online for non-urgent symptoms. For details visit [email or web address].   Also download the MyChart app! Go to the app store, search "MyChart", open the app, select [facility], and log in with your MyChart username and password.  Due to Covid, a mask is required upon entering the hospital/clinic. If you do not have a mask, one will be given to you upon arrival. For doctor visits, patients may have 1 support person aged 18 or older with them. For treatment visits, patients cannot have anyone with them due to current Covid guidelines and our immunocompromised population.  

## 2020-12-28 ENCOUNTER — Emergency Department: Payer: Medicaid Other

## 2020-12-28 ENCOUNTER — Emergency Department
Admission: EM | Admit: 2020-12-28 | Discharge: 2020-12-28 | Disposition: A | Discharge status: Home / Self Care | Payer: Medicaid Other | Attending: Emergency Medicine | Admitting: Emergency Medicine

## 2020-12-28 ENCOUNTER — Other Ambulatory Visit: Payer: Self-pay

## 2020-12-28 DIAGNOSIS — M25562 Pain in left knee: Secondary | ICD-10-CM | POA: Insufficient documentation

## 2020-12-28 DIAGNOSIS — J45909 Unspecified asthma, uncomplicated: Secondary | ICD-10-CM | POA: Insufficient documentation

## 2020-12-28 DIAGNOSIS — K0889 Other specified disorders of teeth and supporting structures: Secondary | ICD-10-CM | POA: Insufficient documentation

## 2020-12-28 MED ORDER — KETOROLAC TROMETHAMINE 30 MG/ML IJ SOLN
30.0000 mg | Freq: Once | INTRAMUSCULAR | Status: AC
  Administered 2020-12-28: 30 mg via INTRAMUSCULAR
  Filled 2020-12-28: qty 1

## 2020-12-28 MED ORDER — KETOROLAC TROMETHAMINE 10 MG PO TABS: 10.0000 mg | ORAL_TABLET | Freq: Four times a day (QID) | ORAL | 0 refills | Status: DC | PRN

## 2020-12-28 MED ORDER — AMOXICILLIN 875 MG PO TABS: 875.0000 mg | ORAL_TABLET | Freq: Two times a day (BID) | ORAL | 0 refills | Status: AC

## 2020-12-28 NOTE — Discharge Instructions (Signed)
OPTIONS FOR DENTAL FOLLOW UP CARE ° °[place] Department of Health and Human Services - Local Safety Net Dental Clinics °http://www.ncdhhs.gov/dph/oralhealth/services/safetynetclinics.htm °  °[facility] (336-562-3123) ° °[facility] (919-933-9087) ° °[facility] (919-663-1744 ext [phone]) ° °[facility] ([phone]) ° °[facility] ([phone]) °This clinic caters to the indigent population and is on a lottery system. °Location: °[facility], [address], [place] °Clinic Hours: °Wednesdays from 6pm - 9pm, patients seen by a lottery system. °For dates, call or go to www.med.[facility].edu/[facility]/patients/Dental-[facility] °Services: °Cleanings, fillings and simple extractions. °Payment Options: °DENTAL WORK IS FREE OF CHARGE. Bring proof of income or support. °Best way to get seen: °Arrive at 5:15 pm - this is a lottery, NOT first come/first serve, so arriving earlier will not increase your chances of being seen. °  °  °[facility] °919-537-3737 °Select option 1 for emergencies °  °Location: °[facility], [address], [place] °Clinic Hours: °No walk-ins accepted - call the day before to schedule an appointment. °Check in times are 9:30 am and 1:30 pm. °Services: °Simple extractions, temporary fillings, pulpectomy/pulp debridement, uncomplicated abscess drainage. °Payment Options: °PAYMENT IS DUE AT THE TIME OF SERVICE.  Fee is usually $100-200, additional surgical procedures (e.g. abscess drainage) may be extra. °Cash, checks, Visa/MasterCard accepted.  Can file Medicaid if patient is covered for dental - patient should call case worker to check. °No discount for [facility] patients. °Best way to get seen: °MUST call the day before and get onto the schedule. Can usually be seen the next 1-2 days. No walk-ins accepted. °  °  °[facility] °[phone] °   °Location: °[facility], [address], [place] °Clinic Hours: °M, W, Th, F 8am or 1:30pm, Tues 9a or 1:30 - first come/first served. °Services: °Simple extractions, temporary fillings, uncomplicated abscess drainage.  You do not need to be an [place] resident. °Payment Options: °PAYMENT IS DUE AT THE TIME OF SERVICE. °Dental insurance, otherwise sliding scale - bring proof of income or support. °Depending on income and treatment needed, cost is usually $50-200. °Best way to get seen: °Arrive early as it is first come/first served. °  °  °[facility] °[phone] °  °Location: °[address] °Clinic Hours: °Mon-Thu 8a-5p °Services: °Most basic dental services including extractions and fillings. °Payment Options: °PAYMENT IS DUE AT THE TIME OF SERVICE. °Sliding scale, up to 50% off - bring proof if income or support. °Medicaid with dental option accepted. °Best way to get seen: °Call to schedule an appointment, can usually be seen within 2 weeks OR they will try to see walk-ins - show up at 8a or 2p (you may have to wait). °  °  °[facility] °919-245-2435 °[place] RESIDENTS ONLY °  °Location: °[facility], [address], [place], [place] [address] °Clinic Hours: By appointment only. °Monday - Thursday 8am-5pm, Friday 8am-12pm °Services: Cleanings, fillings, extractions. °Payment Options: °PAYMENT IS DUE AT THE TIME OF SERVICE. °Cash, Visa or MasterCard. Sliding scale - $30 minimum per service. °Best way to get seen: °Come in to office, complete packet and make an appointment - need proof of income °or support monies for each household member and proof of [place] residence. °Usually takes about a month to get in. °  °  °[facility] °[phone] °  °Location: °[address]   [place] °Clinic Hours: Walk-in Urgent Care Dental Services are offered Monday-Friday  mornings only. °The numbers of emergencies accepted daily is limited to the number of °providers available. °Maximum 15 - Mondays, Wednesdays & Thursdays °Maximum 10 - Tuesdays & Fridays °Services: °You do not need to be a [place] resident to be seen for a dental emergency. °Emergencies are defined as pain, swelling, abnormal bleeding, or dental trauma. Walkins will receive x-rays if needed. °NOTE: Dental cleaning is not an emergency. °Payment Options: °PAYMENT IS DUE AT THE TIME OF SERVICE. °Minimum co-pay is $40.00 for uninsured patients. °Minimum co-pay is $3.00 for Medicaid with dental coverage. °Dental Insurance is accepted and must be presented at time of visit. °Medicare does not cover dental. °Forms of payment: Cash, credit card, checks. °Best way to get seen: °If not previously registered with the clinic, walk-in dental registration begins at 7:15 am and is on a first come/first serve basis. °If previously registered with the clinic, call to make an appointment. °  °  °[facility] °919-776-4359 °[place] RESIDENTS ONLY °  °Location: °[address], [place], [place] °Clinic Hours: °Mon-Thu 10a-2p °Services: Extractions only! °Payment Options: °FREE (donations accepted) - bring proof of income or support °Best way to get seen: °Call and schedule an appointment OR come at 8am on the 1st Monday of every month (except for holidays) when it is first come/first served. °  °  °[facility] °[phone] °  °Location: °[address], [place] °Clinic Hours: °Friday mornings °Services, Payment Options, Best way to get seen: °Call for info °

## 2020-12-28 NOTE — ED Provider Notes (Signed)
Emergency Medicine Provider Triage Evaluation Note  Sharon Salazar, a 30 y.o. female  was evaluated in triage.  Pt complains of left upper mandible pain secondary to a chronically decayed upper molar.  Patient denies any associated fevers, chills, sweats.  She also denies any focal swelling or purulent drainage.  No headaches reported.  She has a second complaint of some left knee pain, noting medial knee pain without any provocation or preceding injury or trauma.  Review of Systems  Positive: Left upper dental pain. Left knee pain  Negative: FCS, NVD  Physical Exam  BP 121/71   Pulse 65   Temp 98.9 F (37.2 C)   Resp 18   LMP 12/21/2020   SpO2 100%  Gen:   Awake, no distress  NAD Resp:  Normal effort CTA MSK:   Moves extremities without difficulty Left knee tender to medial joint line Other:  [facility] RRR  Medical Decision Making  Medically screening exam initiated at 6:18 PM.  Appropriate orders placed.  Sharon Salazar was informed that the remainder of the evaluation will be completed by another provider, this initial triage assessment does not replace that evaluation, and the importance of remaining in the ED until their evaluation is complete.  Patient ED evaluation of left upper jaw, and left knee pain.   [name], PA-C 12/28/20 1819    Phineas Semen, MD 12/28/20 [phone]

## 2020-12-28 NOTE — ED Triage Notes (Signed)
Pt comes with c/o left dental pain and left knee pain since yesterday. Pt denies any recent injuries.

## 2020-12-28 NOTE — ED Provider Notes (Signed)
[facility]-EMERGENCY DEPARTMENT  ____________________________________________  Time seen: Approximately 8:53 PM  I have reviewed the triage vital signs and the nursing notes.   HISTORY  Chief Complaint Dental Pain and Leg Pain   Historian Patient     HPI Sharon Salazar is a 30 y.o. female presents to the emergency department with left upper dental pain and left knee pain that started yesterday.  Patient reports that she has had dental abscesses in the past and has not made an appointment to see a dentist.  She has no pain underneath her tongue or difficulty swallowing.  Patient denies falls or mechanisms of trauma of left knee.   Past Medical History:  Diagnosis Date   Anemia    Anxiety    Asthma      Immunizations up to date:  Yes.     Past Medical History:  Diagnosis Date   Anemia    Anxiety    Asthma     Patient Active Problem List   Diagnosis Date Noted   Iron deficiency anemia 10/01/2020    Past Surgical History:  Procedure Laterality Date   left arm surgery Left    tonsillectcomy      Prior to Admission medications   Medication Sig Start Date End Date Taking? Authorizing Provider  amoxicillin (AMOXIL) 875 MG tablet Take 1 tablet (875 mg total) by mouth 2 (two) times daily for 10 days. 12/28/20 01/07/21 Yes Pia Mau M, PA-C  ketorolac (TORADOL) 10 MG tablet Take 1 tablet (10 mg total) by mouth every 6 (six) hours as needed. 12/28/20  Yes Pia Mau M, PA-C  lidocaine (XYLOCAINE) 2 % solution Use as directed 5 mLs in the mouth or throat every 6 (six) hours as needed for mouth pain. Patient not taking: Reported on 10/01/2020 08/30/20   Joni Reining, PA-C  naproxen (NAPROSYN) 500 MG tablet Take 1 tablet (500 mg total) by mouth 2 (two) times daily with a meal. Patient not taking: Reported on 10/01/2020 08/30/20   Joni Reining, PA-C  oxyCODONE-acetaminophen (PERCOCET) 7.5-325 MG tablet Take 1 tablet by mouth every 6 (six) hours as needed for severe  pain. Patient not taking: Reported on 10/01/2020 08/30/20   Joni Reining, PA-C  oxyCODONE-acetaminophen (PERCOCET/ROXICET) 5-325 MG tablet Take 1 tablet by mouth every 6 (six) hours as needed for severe pain. Patient not taking: Reported on 10/01/2020 09/09/20   Cuthriell, Delorise Royals, PA-C  sertraline (ZOLOFT) 25 MG tablet Take 25 mg by mouth daily. Patient not taking: Reported on 10/01/2020 05/13/20   [provider]  traZODone (DESYREL) 50 MG tablet Take by mouth. Patient not taking: Reported on 10/01/2020 09/17/20 09/17/21  [provider]    Allergies Shellfish allergy, Cranberry, and Other  Family History  Problem Relation Age of Onset   Anemia Mother    Sarcoidosis Maternal Grandmother     Social History Social History   Tobacco Use   Smoking status: Never   Smokeless tobacco: Never  Vaping Use   Vaping Use: Never used  Substance Use Topics   Alcohol use: Not Currently    Comment: occasional   Drug use: Yes    Types: Marijuana     Review of Systems  Constitutional: No fever/chills Eyes:  No discharge ENT: Patient has dental pain.  Respiratory: no cough. No SOB/ use of accessory muscles to breath Gastrointestinal:   No nausea, no vomiting.  No diarrhea.  No constipation.  Musculoskeletal: Patient has left knee pain.  Skin: Negative for rash,  abrasions, lacerations, ecchymosis.    ____________________________________________   PHYSICAL EXAM:  VITAL SIGNS: ED Triage Vitals  Enc Vitals Group     BP 12/28/20 1807 121/71     Pulse Rate 12/28/20 1807 65     Resp 12/28/20 1807 18     Temp 12/28/20 1807 98.9 F (37.2 C)     Temp src --      SpO2 12/28/20 1807 100 %     Weight --      Height --      Head Circumference --      Peak Flow --      Pain Score 12/28/20 1805 10     Pain Loc --      Pain Edu? --      Excl. in GC? --      Constitutional: Alert and oriented. Well appearing and in no acute distress. Eyes: Conjunctivae are normal.  PERRL. EOMI. Head: Atraumatic. ENT:      Nose: No congestion/rhinnorhea.      Mouth/Throat: Mucous membranes are moist.  No significant swelling of the upper and lower jaw.  No pain underneath the tongue. Neck: No stridor.  No cervical spine tenderness to palpation. Cardiovascular: Normal rate, regular rhythm. Normal S1 and S2.  Good peripheral circulation. Respiratory: Normal respiratory effort without tachypnea or retractions. Lungs CTAB. Good air entry to the bases with no decreased or absent breath sounds Gastrointestinal: Bowel sounds x 4 quadrants. Soft and nontender to palpation. No guarding or rigidity. No distention. Musculoskeletal: Full range of motion to all extremities. No obvious deformities noted Neurologic:  Normal for age. No gross focal neurologic deficits are appreciated.  Skin:  Skin is warm, dry and intact. No rash noted. Psychiatric: Mood and affect are normal for age. Speech and behavior are normal.   ____________________________________________   LABS (all labs ordered are listed, but only abnormal results are displayed)  Labs Reviewed - No data to display ____________________________________________  EKG   ____________________________________________  RADIOLOGY Geraldo Pitter, personally viewed and evaluated these images (plain radiographs) as part of my medical decision making, as well as reviewing the written report by the radiologist.  DG Knee Complete 4 Views Left  Result Date: 12/28/2020 CLINICAL DATA:  Medial knee pain common no known injury, initial encounter EXAM: LEFT KNEE - COMPLETE 4+ VIEW COMPARISON:  None. FINDINGS: No evidence of fracture, dislocation, or joint effusion. No evidence of arthropathy or other focal bone abnormality. Soft tissues are unremarkable. IMPRESSION: No acute abnormality noted. Electronically Signed   By: Alcide Clever M.D.   On: 12/28/2020 19:04     ____________________________________________    PROCEDURES  Procedure(s) performed:     Procedures     Medications  ketorolac (TORADOL) 30 MG/ML injection 30 mg (has no administration in time range)     ____________________________________________   INITIAL IMPRESSION / ASSESSMENT AND PLAN / ED COURSE  Pertinent labs & imaging results that were available during my care of the patient were reviewed by me and considered in my medical decision making (see chart for details).    Assessment and plan Dental pain Knee pain 30 year old female presents to the emergency department with left-sided dental pain for the past 24 hours and left knee pain.  Patient was started on amoxicillin and given an injection of Toradol.  She was prescribed oral Toradol for pain.  Dental resources were provided in patient's discharge paperwork.      ____________________________________________  FINAL CLINICAL IMPRESSION(S) / ED DIAGNOSES  Final diagnoses:  Pain, dental  Acute pain of left knee      NEW MEDICATIONS STARTED DURING THIS VISIT:  ED Discharge Orders          Ordered    amoxicillin (AMOXIL) 875 MG tablet  2 times daily        12/28/20 2038    ketorolac (TORADOL) 10 MG tablet  Every 6 hours PRN        12/28/20 2038                This chart was dictated using voice recognition software/Dragon. Despite best efforts to proofread, errors can occur which can change the meaning. Any change was purely unintentional.     Orvil Feil, PA-C 12/28/20 2056    Shaune Pollack, MD 12/28/20 2237

## 2020-12-30 ENCOUNTER — Inpatient Hospital Stay: Payer: Medicaid Other | Attending: Oncology

## 2020-12-30 DIAGNOSIS — D509 Iron deficiency anemia, unspecified: Secondary | ICD-10-CM | POA: Insufficient documentation

## 2020-12-30 DIAGNOSIS — R1013 Epigastric pain: Secondary | ICD-10-CM

## 2020-12-30 LAB — CBC WITH DIFFERENTIAL/PLATELET
Abs Immature Granulocytes: 0.01 10*3/uL (ref 0.00–0.07)
Basophils Absolute: 0 10*3/uL (ref 0.0–0.1)
Basophils Relative: 1 %
Eosinophils Absolute: 0.1 10*3/uL (ref 0.0–0.5)
Eosinophils Relative: 2 %
HCT: 40.3 % (ref 36.0–46.0)
Hemoglobin: 12.9 g/dL (ref 12.0–15.0)
Immature Granulocytes: 0 %
Lymphocytes Relative: 30 %
Lymphs Abs: 1.3 10*3/uL (ref 0.7–4.0)
MCH: 31.1 pg (ref 26.0–34.0)
MCHC: 32 g/dL (ref 30.0–36.0)
MCV: 97.1 fL (ref 80.0–100.0)
Monocytes Absolute: 0.4 10*3/uL (ref 0.1–1.0)
Monocytes Relative: 8 %
Neutro Abs: 2.7 10*3/uL (ref 1.7–7.7)
Neutrophils Relative %: 59 %
Platelets: 188 10*3/uL (ref 150–400)
RBC: 4.15 MIL/uL (ref 3.87–5.11)
RDW: 14.6 % (ref 11.5–15.5)
WBC: 4.4 10*3/uL (ref 4.0–10.5)
nRBC: 0 % (ref 0.0–0.2)

## 2020-12-30 LAB — IRON AND TIBC
Iron: 76 ug/dL (ref 28–170)
Saturation Ratios: 23 % (ref 10.4–31.8)
TIBC: 332 ug/dL (ref 250–450)
UIBC: 256 ug/dL

## 2020-12-30 LAB — PREGNANCY, URINE: Preg Test, Ur: NEGATIVE

## 2020-12-30 LAB — FERRITIN: Ferritin: 29 ng/mL (ref 11–307)

## 2020-12-31 ENCOUNTER — Other Ambulatory Visit: Payer: Medicaid Other

## 2020-12-31 ENCOUNTER — Inpatient Hospital Stay (HOSPITAL_BASED_OUTPATIENT_CLINIC_OR_DEPARTMENT_OTHER): Payer: Self-pay | Admitting: Oncology

## 2020-12-31 ENCOUNTER — Encounter: Payer: Self-pay | Admitting: Oncology

## 2020-12-31 ENCOUNTER — Ambulatory Visit: Payer: Medicaid Other

## 2020-12-31 DIAGNOSIS — D509 Iron deficiency anemia, unspecified: Secondary | ICD-10-CM

## 2020-12-31 NOTE — Progress Notes (Signed)
Patient contacted for Mychart visit. Pt reports that she continues to feel tired.

## 2020-12-31 NOTE — Progress Notes (Signed)
HEMATOLOGY-ONCOLOGY TeleHEALTH VISIT PROGRESS NOTE  I connected with Sharon Salazar on 12/31/20  at  2:00 PM EDT by video enabled telemedicine visit and verified that I am speaking with the correct person using two identifiers. I discussed the limitations, risks, security and privacy concerns of performing an evaluation and management service by telemedicine and the availability of in-person appointments. The patient expressed understanding and agreed to proceed.   Other persons participating in the visit and their role in the encounter:  None  Patient's location: Home  Provider's location: office Chief Complaint: Follow-up for iron deficiency anemia.   INTERVAL HISTORY Sharon Salazar is a 30 y.o. female who has above history reviewed by me today presents for follow up visit for management of iron deficiency anemia Problems and complaints are listed below: Patient has had IV Venofer treatments and tolerated well.  She has no new complaints.  Review of Systems  Constitutional:  Negative for appetite change, chills, fatigue and fever.  HENT:   Negative for hearing loss and voice change.   Eyes:  Negative for eye problems.  Respiratory:  Negative for chest tightness and cough.   Cardiovascular:  Negative for chest pain.  Gastrointestinal:  Negative for abdominal distention, abdominal pain and blood in stool.  Endocrine: Negative for hot flashes.  Genitourinary:  Negative for difficulty urinating and frequency.   Musculoskeletal:  Negative for arthralgias.  Skin:  Negative for itching and rash.  Neurological:  Negative for extremity weakness.  Hematological:  Negative for adenopathy.  Psychiatric/Behavioral:  Negative for confusion.    Past Medical History:  Diagnosis Date   Anemia    Anxiety    Asthma    Past Surgical History:  Procedure Laterality Date   left arm surgery Left    tonsillectcomy      Family History  Problem Relation Age of Onset   Anemia Mother    Sarcoidosis  Maternal Grandmother     Social History   Socioeconomic History   Marital status: Single    Spouse name: Not on file   Number of children: Not on file   Years of education: Not on file   Highest education level: Not on file  Occupational History   Not on file  Tobacco Use   Smoking status: Never   Smokeless tobacco: Never  Vaping Use   Vaping Use: Never used  Substance and Sexual Activity   Alcohol use: Not Currently    Comment: occasional   Drug use: Yes    Types: Marijuana   Sexual activity: Not on file  Other Topics Concern   Not on file  Social History Narrative   Not on file   Social Determinants of Health   Financial Resource Strain: Not on file  Food Insecurity: Not on file  Transportation Needs: Not on file  Physical Activity: Not on file  Stress: Not on file  Social Connections: Not on file  Intimate Partner Violence: Not on file    Current Outpatient Medications on File Prior to Visit  Medication Sig Dispense Refill   amoxicillin (AMOXIL) 875 MG tablet Take 1 tablet (875 mg total) by mouth 2 (two) times daily for 10 days. 20 tablet 0   ketorolac (TORADOL) 10 MG tablet Take 1 tablet (10 mg total) by mouth every 6 (six) hours as needed. 20 tablet 0   sertraline (ZOLOFT) 25 MG tablet Take 25 mg by mouth daily.     lidocaine (XYLOCAINE) 2 % solution Use as directed 5 mLs in the  mouth or throat every 6 (six) hours as needed for mouth pain. (Patient not taking: No sig reported) 100 mL 0   naproxen (NAPROSYN) 500 MG tablet Take 1 tablet (500 mg total) by mouth 2 (two) times daily with a meal. (Patient not taking: No sig reported) 20 tablet 00   oxyCODONE-acetaminophen (PERCOCET) 7.5-325 MG tablet Take 1 tablet by mouth every 6 (six) hours as needed for severe pain. (Patient not taking: No sig reported) 12 tablet 0   oxyCODONE-acetaminophen (PERCOCET/ROXICET) 5-325 MG tablet Take 1 tablet by mouth every 6 (six) hours as needed for severe pain. (Patient not taking: No  sig reported) 10 tablet 0   traZODone (DESYREL) 50 MG tablet Take by mouth. (Patient not taking: No sig reported)     No current facility-administered medications on file prior to visit.    Allergies  Allergen Reactions   Shellfish Allergy Anaphylaxis and Nausea And Vomiting   Cranberry Itching    All berries   Other Other (See Comments)       Observations/Objective: There were no vitals filed for this visit. There is no height or weight on file to calculate BMI.  Physical Exam Neurological:     Mental Status: She is alert.    CBC    Component Value Date/Time   WBC 4.4 12/30/2020 1122   RBC 4.15 12/30/2020 1122   HGB 12.9 12/30/2020 1122   HCT 40.3 12/30/2020 1122   PLT 188 12/30/2020 1122   MCV 97.1 12/30/2020 1122   MCH 31.1 12/30/2020 1122   MCHC 32.0 12/30/2020 1122   RDW 14.6 12/30/2020 1122   LYMPHSABS 1.3 12/30/2020 1122   MONOABS 0.4 12/30/2020 1122   EOSABS 0.1 12/30/2020 1122   BASOSABS 0.0 12/30/2020 1122    CMP     Component Value Date/Time   NA 136 10/01/2020 1150   K 4.3 10/01/2020 1150   CL 104 10/01/2020 1150   CO2 27 10/01/2020 1150   GLUCOSE 93 10/01/2020 1150   BUN 9 10/01/2020 1150   CREATININE 0.65 10/01/2020 1150   CALCIUM 9.0 10/01/2020 1150   PROT 7.0 10/01/2020 1150   ALBUMIN 3.9 10/01/2020 1150   AST 22 10/01/2020 1150   ALT 13 10/01/2020 1150   ALKPHOS 47 10/01/2020 1150   BILITOT 0.6 10/01/2020 1150   GFRNONAA >60 10/01/2020 1150     Assessment and Plan: 1. Iron deficiency anemia, unspecified iron deficiency anemia type     Iron deficiency anemia, labs are reviewed and discussed with patient. She tolerated previous IV Venofer treatments. Labs shows improvement and normalization of both iron stores and hemoglobin. However for additional IV Venofer treatment at this point.  Follow Up Instructions:  6 months  I discussed the assessment and treatment plan with the patient. The patient was provided an opportunity to ask  questions and all were answered. The patient agreed with the plan and demonstrated an understanding of the instructions.  The patient was advised to call back or seek an in-person evaluation if the symptoms worsen or if the condition fails to improve as anticipated.   [name], MD 12/31/2020 10:20 PM

## 2021-01-01 ENCOUNTER — Inpatient Hospital Stay: Payer: Medicaid Other

## 2021-04-10 ENCOUNTER — Encounter: Payer: Self-pay | Admitting: Oncology

## 2021-04-10 ENCOUNTER — Emergency Department
Admission: EM | Admit: 2021-04-10 | Discharge: 2021-04-10 | Disposition: A | Discharge status: Home / Self Care | Payer: No Typology Code available for payment source | Attending: Emergency Medicine | Admitting: Emergency Medicine

## 2021-04-10 ENCOUNTER — Other Ambulatory Visit: Payer: Self-pay

## 2021-04-10 DIAGNOSIS — M791 Myalgia, unspecified site: Secondary | ICD-10-CM | POA: Diagnosis present

## 2021-04-10 DIAGNOSIS — U071 COVID-19: Secondary | ICD-10-CM | POA: Insufficient documentation

## 2021-04-10 DIAGNOSIS — G8929 Other chronic pain: Secondary | ICD-10-CM | POA: Insufficient documentation

## 2021-04-10 DIAGNOSIS — M5441 Lumbago with sciatica, right side: Secondary | ICD-10-CM | POA: Insufficient documentation

## 2021-04-10 DIAGNOSIS — M5442 Lumbago with sciatica, left side: Secondary | ICD-10-CM | POA: Insufficient documentation

## 2021-04-10 LAB — URINALYSIS, ROUTINE W REFLEX MICROSCOPIC
Bacteria, UA: NONE SEEN
Bilirubin Urine: NEGATIVE
Glucose, UA: NEGATIVE mg/dL
Hgb urine dipstick: NEGATIVE
Ketones, ur: NEGATIVE mg/dL
Leukocytes,Ua: NEGATIVE
Nitrite: NEGATIVE
Protein, ur: 30 mg/dL — AB
Specific Gravity, Urine: 1.024 (ref 1.005–1.030)
pH: 6 (ref 5.0–8.0)

## 2021-04-10 LAB — POC URINE PREG, ED: Preg Test, Ur: NEGATIVE

## 2021-04-10 LAB — RESP PANEL BY RT-PCR (FLU A&B, COVID) ARPGX2
Influenza A by PCR: NEGATIVE
Influenza B by PCR: NEGATIVE
SARS Coronavirus 2 by RT PCR: POSITIVE — AB

## 2021-04-10 MED ORDER — LIDOCAINE 5 % EX PTCH: 1.0000 | MEDICATED_PATCH | Freq: Two times a day (BID) | CUTANEOUS | 1 refills | Status: DC

## 2021-04-10 MED ORDER — METHOCARBAMOL 500 MG PO TABS: 500.0000 mg | ORAL_TABLET | Freq: Four times a day (QID) | ORAL | 0 refills | Status: DC | PRN

## 2021-04-10 MED ORDER — KETOROLAC TROMETHAMINE 30 MG/ML IJ SOLN
30.0000 mg | Freq: Once | INTRAMUSCULAR | Status: AC
  Administered 2021-04-10: 30 mg via INTRAMUSCULAR
  Filled 2021-04-10: qty 1

## 2021-04-10 MED ORDER — LIDOCAINE 5 % EX PTCH
1.0000 | MEDICATED_PATCH | Freq: Once | CUTANEOUS | Status: DC
  Administered 2021-04-10: 1 via TRANSDERMAL
  Filled 2021-04-10: qty 1

## 2021-04-10 MED ORDER — ACETAMINOPHEN 500 MG PO TABS
1000.0000 mg | ORAL_TABLET | Freq: Once | ORAL | Status: AC
  Administered 2021-04-10: 1000 mg via ORAL
  Filled 2021-04-10: qty 2

## 2021-04-10 MED ORDER — METHOCARBAMOL 500 MG PO TABS
500.0000 mg | ORAL_TABLET | Freq: Once | ORAL | Status: AC
  Administered 2021-04-10: 500 mg via ORAL
  Filled 2021-04-10: qty 1

## 2021-04-10 NOTE — Discharge Instructions (Addendum)
Please take Tylenol and ibuprofen/Advil for your pain.  It is safe to take them together, or to alternate them every few hours.  Take up to 1000mg  of Tylenol at a time, up to 4 times per day.  Do not take more than 4000 mg of Tylenol in 24 hours.  For ibuprofen, take 400-600 mg, 4-5 times per day.  Or you can replace your ketorolac/Toradol tablets for ibuprofen/Advil.  Do not mix NSAIDs.  Please use lidocaine patches at your site of pain.  Apply 1 patch at a time, leave on for 12 hours, then remove for 12 hours.  12 hours on, 12 hours off.  Do not apply more than 1 patch at a time.,.  Use Robaxin/methocarbamol muscle relaxers for more severe/breakthrough pain.  This medication may make you little bit sleepy.   You did test positive for COVID-19 today.
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
## 2021-04-10 NOTE — ED Triage Notes (Signed)
Pt c/o having right lower back pain, pt also c/o having "hot flashes" and generally not feeling well, states her significant other was recently dx with covid.

## 2021-04-10 NOTE — ED Notes (Signed)
See triage note  presents with lower back pain and body aches  states sx's started couple f days ago  afebrile on arrival   unsure of fever at home but has had chills    states pain moves into both legs  ambulates well

## 2021-04-13 NOTE — Progress Notes (Signed)
..  Patient Assist/Replace for the following has been terminated. Medication: Venofer Reason for Termination: Patient has [employer] starting 03/29/2021. Last DOS: 11/03/2020.  Marland KitchenJuan Quam, CPhT IV Drug Replacement Specialist [place] Phone: [phone]

## 2021-06-25 ENCOUNTER — Ambulatory Visit: Payer: Self-pay | Admitting: Internal Medicine

## 2021-07-07 ENCOUNTER — Other Ambulatory Visit: Payer: Self-pay

## 2021-07-07 ENCOUNTER — Emergency Department
Admission: EM | Admit: 2021-07-07 | Discharge: 2021-07-07 | Disposition: A | Discharge status: Home / Self Care | Payer: 59 | Attending: Emergency Medicine | Admitting: Emergency Medicine

## 2021-07-07 DIAGNOSIS — H6122 Impacted cerumen, left ear: Secondary | ICD-10-CM | POA: Diagnosis not present

## 2021-07-07 DIAGNOSIS — S025XXA Fracture of tooth (traumatic), initial encounter for closed fracture: Secondary | ICD-10-CM | POA: Diagnosis not present

## 2021-07-07 DIAGNOSIS — K0889 Other specified disorders of teeth and supporting structures: Secondary | ICD-10-CM

## 2021-07-07 DIAGNOSIS — X58XXXA Exposure to other specified factors, initial encounter: Secondary | ICD-10-CM | POA: Insufficient documentation

## 2021-07-07 DIAGNOSIS — S0993XA Unspecified injury of face, initial encounter: Secondary | ICD-10-CM | POA: Diagnosis present

## 2021-07-07 MED ORDER — NAPROXEN 500 MG PO TABS: 500.0000 mg | ORAL_TABLET | Freq: Two times a day (BID) | ORAL | 0 refills | Status: DC

## 2021-07-07 MED ORDER — AMOXICILLIN 500 MG PO TABS: 500.0000 mg | ORAL_TABLET | Freq: Three times a day (TID) | ORAL | 0 refills | Status: DC

## 2021-07-07 MED ORDER — TRAMADOL HCL 50 MG PO TABS: 50.0000 mg | ORAL_TABLET | Freq: Four times a day (QID) | ORAL | 0 refills | Status: DC | PRN

## 2021-07-07 NOTE — ED Triage Notes (Signed)
Pt comes pov with dental pain. States a tooth broke off on the left top side and has had pain and some swelling since then. Pain in her left ear too.  ?

## 2021-07-07 NOTE — Discharge Instructions (Signed)
Use Debrox or other ear wax softening drops. ? ?Please call and schedule a dental appointment as soon as possible. You will need to be seen within the next 14 days. Return to the emergency department for symptoms that change or worsen if you're unable to schedule an appointment. ? ? ?OPTIONS FOR DENTAL FOLLOW UP CARE ? ?[place] [facility] and [place] ?OrganicZinc.[email or web address] ?  ?[facility] [phone]) ? ?[employer] [phone]) ? ?[place] [phone]) ? ?[place] [phone]) ? ?[facility] [phone]) ?This clinic caters to the indigent population and is on a lottery system. ?Location: ?[facility], [address], [place] ?Clinic Hours: ?Wednesdays from 6pm - 9pm, patients seen by a lottery system. ?For dates, call or go to [email or web address] ?Services: ?Cleanings, fillings and simple extractions. ?Payment Options: ?DENTAL WORK IS FREE OF CHARGE. Bring proof of income or support. ?Best way to get seen: ?Arrive at 5:15 pm - this is a lottery, NOT first come/first serve, so arriving earlier will not increase your chances of being seen. ?  ?  ?[facility] ?[phone] ?Select option 1 for emergencies ?  ?Location: ?[facility], [address] [place] ?Clinic Hours: ?No walk-ins accepted - call the day before to schedule an appointment. ?Check in times are 9:30 am and 1:30 pm. ?Services: ?Simple extractions, temporary fillings, pulpectomy/pulp debridement, uncomplicated abscess drainage. ?Payment Options: ?PAYMENT IS DUE AT THE TIME OF SERVICE.  Fee is usually $100-200, additional surgical procedures (e.g. abscess drainage) may be extra. ?Cash, checks, Visa/MasterCard accepted.  Can file Medicaid if patient is covered for dental -  patient should call case worker to check. ?No discount for [facility] patients. ?Best way to get seen: ?MUST call the day before and get onto the schedule. Can usually be seen the next 1-2 days. No walk-ins accepted. ?  ?  ?[facility] ?[phone] ?  ?Location: ?[facility] [place] ?Clinic Hours: ?M, W, Th, F 8am or 1:30pm, Tues 9a or 1:30 - first come/first served. ?Services: ?Simple extractions, temporary fillings, uncomplicated abscess drainage.  You do not need to be an [facility] resident. ?Payment Options: ?PAYMENT IS DUE AT THE TIME OF SERVICE. ?Dental insurance, otherwise sliding scale - bring proof of income or support. ?Depending on income and treatment needed, cost is usually $50-200. ?Best way to get seen: ?Arrive early as it is first come/first served. ?  ?  ?[facility] ?[phone] ?  ?Location: ?[place] ?Clinic Hours: ?Mon-Thu 8a-5p ?Services: ?Most basic dental services including extractions and fillings. ?Payment Options: ?PAYMENT IS DUE AT THE TIME OF SERVICE. ?Sliding scale, up to 50% off - bring proof if income or support. ?Medicaid with dental option accepted. ?Best way to get seen: ?Call to schedule an appointment, can usually be seen within 2 weeks OR they will try to see walk-ins - show up at [place] or 2p (you may have to wait). ?  ?  ?[facility] ?[phone] ?[place] RESIDENTS ONLY ?  ?Location: ?[employer], [place] [address] [place], [place] [address] ?Clinic Hours: By appointment only. ?Monday - Thursday 8am-5pm, Friday 8am-12pm ?Services: Cleanings, fillings, extractions. ?Payment Options: ?PAYMENT IS DUE AT THE TIME OF SERVICE. ?Cash, Visa or MasterCard. Sliding scale - $30 minimum per service. ?Best way to get seen: ?Come  in to office, complete packet and make an appointment - need proof of income ?or support monies for each household  member and proof of [facility] residence. ?Usually takes about a month to get in. ?  ?  ?[facility] ?[phone] ?  ?Location: ?[facility] Hours: Walk-in Urgent Care Dental Services are offered Monday-Friday mornings only. ?The numbers of emergencies accepted daily is limited to the number of ?providers available. ?Maximum 15 - Mondays, Wednesdays & Thursdays ?Maximum 10 - Tuesdays & Fridays ?Services: ?You do not need to be a [facility] resident to be seen for a dental emergency. ?Emergencies are defined as pain, swelling, abnormal bleeding, or dental trauma. Walkins will receive x-rays if needed. ?NOTE: Dental cleaning is not an emergency. ?Payment Options: ?PAYMENT IS DUE AT THE TIME OF SERVICE. ?Minimum co-pay is $40.00 for uninsured patients. ?Minimum co-pay is $3.00 for Medicaid with dental coverage. ?Dental Insurance is accepted and must be presented at time of visit. ?Medicare does not cover dental. ?Forms of payment: Cash, credit card, checks. ?Best way to get seen: ?If not previously registered with the clinic, walk-in dental registration begins at 7:15 am and is on a first come/first serve basis. ?If previously registered with the clinic, call to make an appointment. ?  ?  ?[facility] ?[phone] ?[place] RESIDENTS ONLY ?  ?Location: ?[address], [place], [place] ?Clinic Hours: ?Mon-Thu 10a-2p ?Services: Extractions only! ?Payment Options: ?FREE (donations accepted) - bring proof of income or support ?Best way to get seen: ?Call and schedule an appointment OR come at 8am on the 1st Monday of every month (except for holidays) when it is first come/first served. ?  ?  ?[facility] ?[phone] ?  ?Location: ?[address] [place] ?Clinic Hours: ?Friday mornings ?Services, Payment Options, Best way to get seen: ?Call for info ? ?

## 2021-07-07 NOTE — ED Provider Notes (Signed)
? ?[facility] ?Provider Note ? ? ? Event Date/Time  ? First MD Initiated Contact with Patient 07/07/21 0845   ?  (approximate) ? ? ?History  ? ?Dental Pain ? ? ?HPI ? ?Sharon Salazar is a 31 y.o. female with history of anemia and asthma and as listed in EMR presents to the emergency department for treatment and evaluation of dental pain and left ear pain.  Symptoms have been present for the past several days.  No facial swelling or fever.  No relief with over-the-counter medications.. ? ?  ? ? ?Physical Exam  ? ?Triage Vital Signs: ?ED Triage Vitals  ?Enc Vitals Group  ?   BP 07/07/21 0840 111/68  ?   Pulse Rate 07/07/21 0840 74  ?   Resp 07/07/21 0840 18  ?   Temp 07/07/21 0840 98.1 ?F (36.7 ?C)  ?   Temp Source 07/07/21 0840 Oral  ?   SpO2 07/07/21 0840 99 %  ?   Weight 07/07/21 0837 140 lb (63.5 kg)  ?   Height 07/07/21 0844 5\' 7"  (1.702 m)  ?   Head Circumference --   ?   Peak Flow --   ?   Pain Score 07/07/21 0837 9  ?   Pain Loc --   ?   Pain Edu? --   ?   Excl. in GC? --   ? ? ?Most recent vital signs: ?Vitals:  ? 07/07/21 0840  ?BP: 111/68  ?Pulse: 74  ?Resp: 18  ?Temp: 98.1 ?F (36.7 ?C)  ?SpO2: 99%  ? ? ?General: Awake, no distress.  ?CV:  Good peripheral perfusion.  ?Resp:  Normal effort.  ?Abd:  No distention.  ?Other:  Cerumen impaction on the left.  Chronic appearing dental fracture upper left molar without associated gingival edema. ? ? ?ED Results / Procedures / Treatments  ? ?Labs ?(all labs ordered are listed, but only abnormal results are displayed) ?Labs Reviewed - No data to display ? ? ?EKG ? ?Not indicated ? ? ?RADIOLOGY ? ?Image and radiology report reviewed by me. ? ?Not indicated ? ? ?PROCEDURES: ? ?Critical Care performed: No ? ?Procedures ? ? ?MEDICATIONS ORDERED IN ED: ?Medications - No data to display ? ? ?IMPRESSION / MDM / ASSESSMENT AND PLAN / ED COURSE  ? ?I have reviewed the triage note. ? ?Differential diagnosis includes, but is not limited to, dental pain,  dental abscess, otitis media, sinusitis ? ?31 year old female presenting to the emergency department for treatment and evaluation of dental pain and left earache.  See HPI for further details. ? ?On exam, she does have cerumen impaction on the left side and chronic.  Dental fracture of the left upper molar.  She will be covered with antibiotics until she can get in with a dentist.  She was encouraged to get Debrox or over-the-counter cerumen softener and use as directed on the packaging.  She is to call around to make an appointment with the dentist.  She will be provided with community resource list.  If symptoms change or worsen and she is unable to schedule appointment, she is to return to the emergency department. ? ?  ? ? ?FINAL CLINICAL IMPRESSION(S) / ED DIAGNOSES  ? ?Final diagnoses:  ?Pain, dental  ?Impacted cerumen of left ear  ? ? ? ?Rx / DC Orders  ? ?ED Discharge Orders   ? ?      Ordered  ?  amoxicillin (AMOXIL) 500 MG tablet  3 times  daily       ? 07/07/21 0858  ?  traMADol (ULTRAM) 50 MG tablet  Every 6 hours PRN       ? 07/07/21 0858  ?  naproxen (NAPROSYN) 500 MG tablet  2 times daily with meals       ? 07/07/21 0858  ? ?  ?  ? ?  ? ? ? ?Note:  This document was prepared using Dragon voice recognition software and may include unintentional dictation errors. ?  Chinita Pester, FNP ?07/07/21 1607 ? ?  ?Concha Se, MD ?07/08/21 1912 ? ?

## 2021-07-07 NOTE — ED Notes (Signed)
See triage note  presents with dental pain  states pain is to left upper gum line  states she broke a tooth and thought she lost the entire tooth  no feeling increased pain ?

## 2021-07-09 ENCOUNTER — Encounter: Payer: Self-pay | Admitting: Oncology

## 2021-09-03 ENCOUNTER — Other Ambulatory Visit: Payer: Self-pay

## 2021-09-03 ENCOUNTER — Emergency Department
Admission: EM | Admit: 2021-09-03 | Discharge: 2021-09-03 | Disposition: A | Discharge status: Home / Self Care | Payer: No Typology Code available for payment source | Attending: Emergency Medicine | Admitting: Emergency Medicine

## 2021-09-03 ENCOUNTER — Encounter: Payer: Self-pay | Admitting: Oncology

## 2021-09-03 DIAGNOSIS — R109 Unspecified abdominal pain: Secondary | ICD-10-CM | POA: Insufficient documentation

## 2021-09-03 DIAGNOSIS — R197 Diarrhea, unspecified: Secondary | ICD-10-CM | POA: Insufficient documentation

## 2021-09-03 LAB — URINALYSIS, ROUTINE W REFLEX MICROSCOPIC
Bacteria, UA: NONE SEEN
Bilirubin Urine: NEGATIVE
Glucose, UA: NEGATIVE mg/dL
Ketones, ur: NEGATIVE mg/dL
Leukocytes,Ua: NEGATIVE
Nitrite: NEGATIVE
Protein, ur: 30 mg/dL — AB
Specific Gravity, Urine: 1.024 (ref 1.005–1.030)
pH: 5 (ref 5.0–8.0)

## 2021-09-03 LAB — CBC
HCT: 35.3 % — ABNORMAL LOW (ref 36.0–46.0)
Hemoglobin: 11.1 g/dL — ABNORMAL LOW (ref 12.0–15.0)
MCH: 29.1 pg (ref 26.0–34.0)
MCHC: 31.4 g/dL (ref 30.0–36.0)
MCV: 92.7 fL (ref 80.0–100.0)
Platelets: 289 10*3/uL (ref 150–400)
RBC: 3.81 MIL/uL — ABNORMAL LOW (ref 3.87–5.11)
RDW: 13.2 % (ref 11.5–15.5)
WBC: 4 10*3/uL (ref 4.0–10.5)
nRBC: 0 % (ref 0.0–0.2)

## 2021-09-03 LAB — COMPREHENSIVE METABOLIC PANEL
ALT: 14 U/L (ref 0–44)
AST: 20 U/L (ref 15–41)
Albumin: 3.7 g/dL (ref 3.5–5.0)
Alkaline Phosphatase: 65 U/L (ref 38–126)
Anion gap: 4 — ABNORMAL LOW (ref 5–15)
BUN: 8 mg/dL (ref 6–20)
CO2: 26 mmol/L (ref 22–32)
Calcium: 8.9 mg/dL (ref 8.9–10.3)
Chloride: 109 mmol/L (ref 98–111)
Creatinine, Ser: 0.66 mg/dL (ref 0.44–1.00)
GFR, Estimated: >60 mL/min (ref >60)
Glucose, Bld: 90 mg/dL (ref 70–99)
Potassium: 3.5 mmol/L (ref 3.5–5.1)
Sodium: 139 mmol/L (ref 135–145)
Total Bilirubin: 0.6 mg/dL (ref 0.3–1.2)
Total Protein: 7.1 g/dL (ref 6.5–8.1)

## 2021-09-03 LAB — POC URINE PREG, ED: Preg Test, Ur: NEGATIVE

## 2021-09-03 LAB — LIPASE, BLOOD: Lipase: 29 U/L (ref 11–51)

## 2021-09-03 NOTE — ED Triage Notes (Signed)
Patient to ER via POV with complaints of generalized abdominal pain that started on Sunday, non-radiating. Describes it as a "bubbling" sensation. States that she started having diarrhea yesterday.   Patient also reports back pain and cramps on Sunday, states her cycle was due yesterday and it is unusual for her to be early.

## 2021-09-03 NOTE — Discharge Instructions (Signed)
Patient planing of fluids, use over-the-counter antidiarrheal medication such as Pepto-Bismol or Imodium as written on the box.  Return to the emergency department for any symptoms personally concerning to yourself.  Otherwise please follow-up with your doctor for recheck/reevaluation.

## 2021-09-03 NOTE — ED Provider Notes (Signed)
   [facility] Provider Note    Event Date/Time   First MD Initiated Contact with Patient 09/03/21 [phone]     (approximate)  History   Chief Complaint: Abdominal Pain  HPI  Sharon Salazar is a 31 y.o. female with no significant past medical history presents to the emergency department for abdominal discomfort and loose stool.  According to the patient on Sunday she began experiencing some abdominal cramping and started her menstrual cycle.  She states since then she has been experiencing intermittent episodes of loose stool.  Denies any nausea or vomiting.  States her fianc currently has a "stomach infection" as well.  Patient denies any urinary symptoms, no vaginal discharge.  No fever cough or congestion.  Physical Exam   Triage Vital Signs: ED Triage Vitals [09/03/21 0806]  Enc Vitals Group     BP 120/76     Pulse Rate (!) 59     Resp 18     Temp 98.3 F (36.8 C)     Temp Source Oral     SpO2 100 %     Weight 145 lb (65.8 kg)     Height 5\' 7"  (1.702 m)     Head Circumference      Peak Flow      Pain Score 7     Pain Loc      Pain Edu?      Excl. in GC?     Most recent vital signs: Vitals:   09/03/21 0806  BP: 120/76  Pulse: (!) 59  Resp: 18  Temp: 98.3 F (36.8 C)  SpO2: 100%    General: Awake, no distress.  CV:  Good peripheral perfusion.  Regular rate and rhythm  Resp:  Normal effort.  Equal breath sounds bilaterally.  Abd:  No distention.  Soft, nontender.  No rebound or guarding.  Benign abdomen.    ED Results / Procedures / Treatments   MEDICATIONS ORDERED IN ED: Medications - No data to display   IMPRESSION / MDM / ASSESSMENT AND PLAN / ED COURSE  I reviewed the triage vital signs and the nursing notes.  Patient's presentation is most consistent with acute presentation with potential threat to life or bodily function.  Patient presents the emergency department for intermittent abdominal cramping intermittent episodes of  loose stool over the past 4 days or so.  No fever no cough congestion no nausea or vomiting no urinary symptoms.  Overall the patient appears well.  Lab work thus far is reassuring CBC shows normal results, urinalysis is normal chemistry pending, urine pregnancy test is negative.  Chemistry has resulted normal as well.  Given the patient's reassuring work-up I believe the patient will be safe for discharge home with outpatient follow-up.  Patient agreeable to plan of care.  FINAL CLINICAL IMPRESSION(S) / ED DIAGNOSES   Diarrhea Abdominal cramping    Note:  This document was prepared using Dragon voice recognition software and may include unintentional dictation errors.   11/03/21, MD 09/03/21 [phone]

## 2021-12-21 ENCOUNTER — Other Ambulatory Visit: Payer: Self-pay

## 2021-12-21 ENCOUNTER — Emergency Department
Admission: EM | Admit: 2021-12-21 | Discharge: 2021-12-21 | Disposition: A | Discharge status: Home / Self Care | Payer: Medicaid Other | Attending: Emergency Medicine | Admitting: Emergency Medicine

## 2021-12-21 DIAGNOSIS — J45909 Unspecified asthma, uncomplicated: Secondary | ICD-10-CM | POA: Insufficient documentation

## 2021-12-21 DIAGNOSIS — K0889 Other specified disorders of teeth and supporting structures: Secondary | ICD-10-CM

## 2021-12-21 DIAGNOSIS — K029 Dental caries, unspecified: Secondary | ICD-10-CM | POA: Insufficient documentation

## 2021-12-21 MED ORDER — AMOXICILLIN 500 MG PO CAPS
500.0000 mg | ORAL_CAPSULE | Freq: Once | ORAL | Status: AC
  Administered 2021-12-21: 500 mg via ORAL
  Filled 2021-12-21: qty 1

## 2021-12-21 MED ORDER — AMOXICILLIN 500 MG PO TABS: 500.0000 mg | ORAL_TABLET | Freq: Three times a day (TID) | ORAL | 0 refills | Status: AC

## 2021-12-21 MED ORDER — NAPROXEN 250 MG PO TABS: 250.0000 mg | ORAL_TABLET | Freq: Two times a day (BID) | ORAL | 0 refills | Status: AC

## 2021-12-21 MED ORDER — NAPROXEN 500 MG PO TABS
250.0000 mg | ORAL_TABLET | Freq: Once | ORAL | Status: AC
  Administered 2021-12-21: 250 mg via ORAL
  Filled 2021-12-21: qty 1

## 2021-12-21 NOTE — Discharge Instructions (Addendum)
Please take the antibiotic 3 times a day for the next 7 days.  You can take the naproxen for pain.  Please schedule an appointment with a dentist.

## 2021-12-21 NOTE — ED Triage Notes (Signed)
Pt presents to ED via POV with c/o of R sided dental pain. Started 1 week ago. Pt denies fevers or chills.

## 2021-12-21 NOTE — ED Provider Notes (Signed)
[facility] Provider Note    Event Date/Time   First MD Initiated Contact with Patient 12/21/21 [phone]     (approximate)   History   Dental Pain   HPI  Sharon Salazar is a 31 y.o. female  with pmh anemia, ashtma, anxiety who p/w dental pain. Pain is in the R lower part of the mouth. Patient has had pain in this area before. Started about 1 week ago. Denies fever or chills. Does not have a dentist.      Past Medical History:  Diagnosis Date   Anemia    Anxiety    Asthma     Patient Active Problem List   Diagnosis Date Noted   Iron deficiency anemia 10/01/2020     Physical Exam  Triage Vital Signs: ED Triage Vitals  Enc Vitals Group     BP 12/21/21 0850 114/72     Pulse Rate 12/21/21 0850 61     Resp 12/21/21 0850 16     Temp 12/21/21 0850 98.1 F (36.7 C)     Temp Source 12/21/21 0850 Oral     SpO2 12/21/21 0850 98 %     Weight --      Height --      Head Circumference --      Peak Flow --      Pain Score 12/21/21 0851 9     Pain Loc --      Pain Edu? --      Excl. in [place]? --     Most recent vital signs: Vitals:   12/21/21 0850  BP: 114/72  Pulse: 61  Resp: 16  Temp: 98.1 F (36.7 C)  SpO2: 98%     General: Awake, no distress.  CV:  Good peripheral perfusion.  Resp:  Normal effort.  Abd:  No distention.  Neuro:             Awake, Alert, Oriented x 3  Other:  Multiple dental caries and missing teeth. R mandibular premolar is mostly missing there is tenderness to palpation over the root, no surrounding fluctuance No trismus or facial swelling   ED Results / Procedures / Treatments  Labs (all labs ordered are listed, but only abnormal results are displayed) Labs Reviewed - No data to display   EKG     RADIOLOGY    PROCEDURES:  Critical Care performed: No  Procedures   MEDICATIONS ORDERED IN ED: Medications  naproxen (NAPROSYN) tablet 250 mg (250 mg Oral Given 12/21/21 0915)  amoxicillin (AMOXIL)  capsule 500 mg (500 mg Oral Given 12/21/21 0915)     IMPRESSION / MDM / ASSESSMENT AND PLAN / ED COURSE  I reviewed the triage vital signs and the nursing notes.                              Patient's presentation is most consistent with acute, uncomplicated illness.  Differential diagnosis includes, but is not limited to, dental caries, pulpitis, periapical abscess  The patient is a 31 year old female presenting with right lower premolar dental pain x1 week.  Patient has noticed some purulent drainage from an area of skin on the gingiva near the tooth.  There is no facial swelling no fevers.  She has not seen a dentist in some time.  On exam she is multiple dental caries including the right lower premolar which is mostly eroded.  I do not appreciate any fluctuance there  is no purulent discharge.  Given patient did see some pus will prescribe amoxicillin.  Prescribed naproxen for pain.  I encouraged dental follow-up.       FINAL CLINICAL IMPRESSION(S) / ED DIAGNOSES   Final diagnoses:  Pain, dental     Rx / DC Orders   ED Discharge Orders          Ordered    amoxicillin (AMOXIL) 500 MG tablet  3 times daily        12/21/21 0858    naproxen (NAPROSYN) 250 MG tablet  2 times daily with meals        12/21/21 0858             Note:  This document was prepared using Dragon voice recognition software and may include unintentional dictation errors.   Georga Hacking, MD 12/21/21 [phone]

## 2022-01-07 ENCOUNTER — Emergency Department
Admission: EM | Admit: 2022-01-07 | Discharge: 2022-01-07 | Disposition: A | Discharge status: Home / Self Care | Payer: Medicaid Other | Attending: Emergency Medicine | Admitting: Emergency Medicine

## 2022-01-07 ENCOUNTER — Other Ambulatory Visit: Payer: Self-pay

## 2022-01-07 ENCOUNTER — Emergency Department: Payer: Medicaid Other

## 2022-01-07 DIAGNOSIS — L03011 Cellulitis of right finger: Secondary | ICD-10-CM | POA: Insufficient documentation

## 2022-01-07 MED ORDER — CEPHALEXIN 500 MG PO CAPS: 500.0000 mg | ORAL_CAPSULE | Freq: Three times a day (TID) | ORAL | 0 refills | Status: AC

## 2022-01-07 MED ORDER — CEPHALEXIN 500 MG PO CAPS
500.0000 mg | ORAL_CAPSULE | Freq: Once | ORAL | Status: AC
  Administered 2022-01-07: 500 mg via ORAL
  Filled 2022-01-07: qty 1

## 2022-01-07 NOTE — Discharge Instructions (Addendum)
Take the antibiotic as directed. Soak the finger in warm epson salt soaks. Follow-up with the emergency department for signs of worsening infection, pain, or swelling.

## 2022-01-07 NOTE — ED Provider Notes (Signed)
[facility] Emergency Department Provider Note     Event Date/Time   First MD Initiated Contact with Patient 01/07/22 1436     (approximate)   History   Finger Injury   HPI  Sharon Salazar is a 31 y.o. female presents to the ED for evaluation of right middle finger pain. She does report she might have hit it on something a few days earlier. She notes tenderness to the felon but denies hangnail, cuticle tear, or infection. She does endorse psoriasis to the hands, and notes fissures and cracks intermittently.    Physical Exam   Triage Vital Signs: ED Triage Vitals [01/07/22 1359]  Enc Vitals Group     BP 112/69     Pulse Rate 73     Resp 16     Temp 98.3 F (36.8 C)     Temp Source Oral     SpO2 100 %     Weight 145 lb 1 oz (65.8 kg)     Height 5\' 7"  (1.702 m)     Head Circumference      Peak Flow      Pain Score 10     Pain Loc      Pain Edu?      Excl. in GC?     Most recent vital signs: Vitals:   01/07/22 1359  BP: 112/69  Pulse: 73  Resp: 16  Temp: 98.3 F (36.8 C)  SpO2: 100%    General Awake, no distress. NAD CV:  Good peripheral perfusion.  RESP:  Normal effort.  ABD:  No distention.  SKIN:  Hands with hypertrophic, hyperpigmented skin changes consistent with right ring finger with tenderness and tightness to the fat pad. No paronychia noted. Decreased ROM to the PIP of the ring finger.    ED Results / Procedures / Treatments   Labs (all labs ordered are listed, but only abnormal results are displayed) Labs Reviewed - No data to display   EKG    RADIOLOGY  I personally viewed and evaluated these images as part of my medical decision making, as well as reviewing the written report by the radiologist.  ED Provider Interpretation: no acute fracture or dislocation.  DG Hand Complete Right  Result Date: 01/07/2022 CLINICAL DATA:  Pain, swelling right ring finger. EXAM: RIGHT HAND - COMPLETE 3+ VIEW COMPARISON:   None Available. FINDINGS: There is no evidence of fracture or dislocation. There is no evidence of arthropathy or other focal bone abnormality. Soft tissues are unremarkable. IMPRESSION: Negative. Electronically Signed   By: 03/09/2022 M.D.   On: 01/07/2022 14:17     PROCEDURES:  Critical Care performed: No  Procedures   MEDICATIONS ORDERED IN ED: Medications  cephALEXin (KEFLEX) capsule 500 mg (500 mg Oral Given 01/07/22 1508)     IMPRESSION / MDM / ASSESSMENT AND PLAN / ED COURSE  I reviewed the triage vital signs and the nursing notes.                              Differential diagnosis includes, but is not limited to, felon, paronychia, subungual hemorrhage, crush injury  Patient's presentation is most consistent with acute complicated illness / injury requiring diagnostic workup.  Patient's diagnosis is consistent with felon.  Right ring finger with some pulp space tenderness but no pointing, fluctuance, or tense soft tissue swelling.  Patient will be discharged home with prescriptions for Keflex  and instructions to perform warm salt water soaks. Patient is to follow up with her primary provider or this ED as needed or otherwise directed. Patient is given ED precautions to return to the ED for any worsening or new symptoms.     FINAL CLINICAL IMPRESSION(S) / ED DIAGNOSES   Final diagnoses:  Felon of finger of right hand     Rx / DC Orders   ED Discharge Orders          Ordered    cephALEXin (KEFLEX) 500 MG capsule  3 times daily        01/07/22 1504             Note:  This document was prepared using Dragon voice recognition software and may include unintentional dictation errors.    Melvenia Needles, PA-C 01/07/22 2020    Lucillie Garfinkel, MD 01/08/22 [phone]

## 2022-01-07 NOTE — ED Provider Triage Note (Signed)
Emergency Medicine Provider Triage Evaluation Note  Sharon Salazar , a 31 y.o. female  was evaluated in triage.  Pt complains of right fourth finger pain.  Thinks she hit it on something.  It is red and swollen.  Review of Systems  Positive:  Negative:   Physical Exam  There were no vitals taken for this visit. Gen:   Awake, no distress   Resp:  Normal effort  MSK:   Moves extremities without difficulty  Other:    Medical Decision Making  Medically screening exam initiated at 1:56 PM.  Appropriate orders placed.  Ayeisha Lindenberger was informed that the remainder of the evaluation will be completed by another provider, this initial triage assessment does not replace that evaluation, and the importance of remaining in the ED until their evaluation is complete.  X-ray of the right hand   Versie Starks, PA-C 01/07/22 1357

## 2022-01-07 NOTE — ED Triage Notes (Signed)
Pt here with a swollen right ring finger that started yesterday. Pt states she does not remember injuring her finger but she has not been able to move it today. Pt stable in triage.

## 2022-02-08 ENCOUNTER — Encounter: Payer: Self-pay | Admitting: Emergency Medicine

## 2022-02-08 ENCOUNTER — Emergency Department
Admission: EM | Admit: 2022-02-08 | Discharge: 2022-02-08 | Disposition: A | Discharge status: Home / Self Care | Payer: Self-pay | Attending: Emergency Medicine | Admitting: Emergency Medicine

## 2022-02-08 ENCOUNTER — Other Ambulatory Visit: Payer: Self-pay

## 2022-02-08 ENCOUNTER — Emergency Department: Payer: Medicaid Other

## 2022-02-08 DIAGNOSIS — J45901 Unspecified asthma with (acute) exacerbation: Secondary | ICD-10-CM | POA: Insufficient documentation

## 2022-02-08 DIAGNOSIS — Z8616 Personal history of COVID-19: Secondary | ICD-10-CM | POA: Insufficient documentation

## 2022-02-08 DIAGNOSIS — J069 Acute upper respiratory infection, unspecified: Secondary | ICD-10-CM | POA: Insufficient documentation

## 2022-02-08 DIAGNOSIS — Z1152 Encounter for screening for COVID-19: Secondary | ICD-10-CM | POA: Insufficient documentation

## 2022-02-08 LAB — RESP PANEL BY RT-PCR (FLU A&B, COVID) ARPGX2
Influenza A by PCR: NEGATIVE
Influenza B by PCR: NEGATIVE
SARS Coronavirus 2 by RT PCR: NEGATIVE

## 2022-02-08 LAB — POC URINE PREG, ED: Preg Test, Ur: NEGATIVE

## 2022-02-08 MED ORDER — PREDNISONE 20 MG PO TABS
60.0000 mg | ORAL_TABLET | Freq: Once | ORAL | Status: AC
  Administered 2022-02-08: 60 mg via ORAL
  Filled 2022-02-08: qty 3

## 2022-02-08 MED ORDER — ALBUTEROL SULFATE HFA 108 (90 BASE) MCG/ACT IN AERS: 2.0000 | INHALATION_SPRAY | Freq: Four times a day (QID) | RESPIRATORY_TRACT | 0 refills | Status: AC | PRN

## 2022-02-08 MED ORDER — IPRATROPIUM-ALBUTEROL 0.5-2.5 (3) MG/3ML IN SOLN
3.0000 mL | Freq: Once | RESPIRATORY_TRACT | Status: AC
  Administered 2022-02-08: 3 mL via RESPIRATORY_TRACT
  Filled 2022-02-08: qty 3

## 2022-02-08 MED ORDER — PREDNISONE 20 MG PO TABS: 60.0000 mg | ORAL_TABLET | Freq: Every day | ORAL | 0 refills | Status: AC

## 2022-02-08 NOTE — ED Triage Notes (Signed)
C/O sinus congestion and runny nose x 1 week.  Denies fever/chills.  AAOx3.  Skin warm and dry. NAD

## 2022-02-08 NOTE — ED Provider Notes (Signed)
[facility] Provider Note    Event Date/Time   First MD Initiated Contact with Patient 02/08/22 [phone]     (approximate)   History   Chief Complaint URI   HPI  Sharon Salazar is a 31 y.o. female with past medical history of asthma and anxiety who presents to the ED complaining of cough and congestion.  Patient reports that she has had about 1 week of nasal congestion and drainage, over the past few days has developed worsening cough productive of whitish sputum.  She denies any associated fevers, but states she has started to feel tight in her chest like her asthma is acting up.  She reports some difficulty breathing and pain in her chest when she goes to cough, but does not have pain or shortness of breath between coughing episodes.  She does not currently have an inhaler at home to use.  She has not noticed any pain or swelling in her legs.     Physical Exam   Triage Vital Signs: ED Triage Vitals  Enc Vitals Group     BP 02/08/22 0849 114/65     Pulse Rate 02/08/22 0849 72     Resp 02/08/22 0849 16     Temp 02/08/22 0849 98.5 F (36.9 C)     Temp Source 02/08/22 0849 Oral     SpO2 02/08/22 0849 97 %     Weight 02/08/22 0835 145 lb 1 oz (65.8 kg)     Height 02/08/22 0835 5\' 7"  (1.702 m)     Head Circumference --      Peak Flow --      Pain Score 02/08/22 0835 0     Pain Loc --      Pain Edu? --      Excl. in [place]? --     Most recent vital signs: Vitals:   02/08/22 0849  BP: 114/65  Pulse: 72  Resp: 16  Temp: 98.5 F (36.9 C)  SpO2: 97%    Constitutional: Alert and oriented. Eyes: Conjunctivae are normal. Head: Atraumatic. Nose: No congestion/rhinnorhea. Mouth/Throat: Mucous membranes are moist.  Cardiovascular: Normal rate, regular rhythm. Grossly normal heart sounds.  2+ radial pulses bilaterally. Respiratory: Normal respiratory effort.  No retractions. Lungs with mild end expiratory wheezing. Gastrointestinal: Soft and nontender. No  distention. Musculoskeletal: No lower extremity tenderness nor edema.  Neurologic:  Normal speech and language. No gross focal neurologic deficits are appreciated.    ED Results / Procedures / Treatments   Labs (all labs ordered are listed, but only abnormal results are displayed) Labs Reviewed  RESP PANEL BY RT-PCR (FLU A&B, COVID) [id number]  POC URINE PREG, ED   RADIOLOGY Chest x-ray reviewed and interpreted by me with no infiltrate, edema, or effusion.  PROCEDURES:  Critical Care performed: No  Procedures   MEDICATIONS ORDERED IN ED: Medications  ipratropium-albuterol (DUONEB) 0.5-2.5 (3) MG/3ML nebulizer solution 3 mL (3 mLs Nebulization Given 02/08/22 0931)  predniSONE (DELTASONE) tablet 60 mg (60 mg Oral Given 02/08/22 0931)     IMPRESSION / MDM / ASSESSMENT AND PLAN / ED COURSE  I reviewed the triage vital signs and the nursing notes.                              31 y.o. female with past medical history of asthma and anxiety who presents to the ED complaining of nasal drainage and congestion for the past week, now  with worsening cough and some tightness in her chest.  Patient's presentation is most consistent with acute complicated illness / injury requiring diagnostic workup.  Differential diagnosis includes, but is not limited to, sinusitis, viral URI, bronchitis, pneumonia, asthma exacerbation.  Patient well-appearing and in no acute distress, vital signs are unremarkable.  She is breathing comfortably on room air with oxygen saturations of 97%, lungs with some mild end expiratory wheezing on exam.  She appears to be dealing with a viral URI versus bronchitis that has exacerbated her asthma.  We will treat with dose of prednisone and DuoNeb, further assess with chest x-ray.  Testing sent for COVID-19 and influenza.  Patient also requesting a pregnancy test that is negative.  Chest x-ray is unremarkable, COVID-19 testing and influenza testing are negative.  Wheezing  has resolved on reassessment and patient continues to breathe comfortably following breathing treatment.  She is appropriate for discharge home with PCP follow-up, will be prescribed a course of steroids and refill of albuterol inhaler.  She was counseled to return to the ED for new or worsening symptoms, patient agrees with plan.      FINAL CLINICAL IMPRESSION(S) / ED DIAGNOSES   Final diagnoses:  Exacerbation of asthma, unspecified asthma severity, unspecified whether persistent  Viral URI with cough     Rx / DC Orders   ED Discharge Orders          Ordered    predniSONE (DELTASONE) 20 MG tablet  Daily with breakfast        02/08/22 1021    albuterol (VENTOLIN HFA) 108 (90 Base) MCG/ACT inhaler  Every 6 hours PRN       Note to Pharmacy: Please supply with spacer   02/08/22 1021             Note:  This document was prepared using Dragon voice recognition software and may include unintentional dictation errors.   Chesley Noon, MD 02/08/22 1022

## 2022-02-26 DIAGNOSIS — Z419 Encounter for procedure for purposes other than remedying health state, unspecified: Secondary | ICD-10-CM | POA: Diagnosis not present

## 2022-03-09 DIAGNOSIS — R053 Chronic cough: Secondary | ICD-10-CM | POA: Diagnosis not present

## 2022-03-09 DIAGNOSIS — R140 Abdominal distension (gaseous): Secondary | ICD-10-CM | POA: Diagnosis not present

## 2022-03-09 DIAGNOSIS — B9681 Helicobacter pylori [H. pylori] as the cause of diseases classified elsewhere: Secondary | ICD-10-CM | POA: Diagnosis not present

## 2022-03-09 DIAGNOSIS — K219 Gastro-esophageal reflux disease without esophagitis: Secondary | ICD-10-CM | POA: Diagnosis not present

## 2022-03-09 DIAGNOSIS — R0600 Dyspnea, unspecified: Secondary | ICD-10-CM | POA: Diagnosis not present

## 2022-03-09 DIAGNOSIS — J4520 Mild intermittent asthma, uncomplicated: Secondary | ICD-10-CM | POA: Diagnosis not present

## 2022-03-18 DIAGNOSIS — F3131 Bipolar disorder, current episode depressed, mild: Secondary | ICD-10-CM | POA: Diagnosis not present

## 2022-03-18 DIAGNOSIS — B351 Tinea unguium: Secondary | ICD-10-CM | POA: Diagnosis not present

## 2022-03-18 DIAGNOSIS — L608 Other nail disorders: Secondary | ICD-10-CM | POA: Diagnosis not present

## 2022-03-18 DIAGNOSIS — F419 Anxiety disorder, unspecified: Secondary | ICD-10-CM | POA: Diagnosis not present

## 2022-03-25 ENCOUNTER — Encounter: Payer: Self-pay | Admitting: Oncology

## 2022-03-29 DIAGNOSIS — Z419 Encounter for procedure for purposes other than remedying health state, unspecified: Secondary | ICD-10-CM | POA: Diagnosis not present

## 2022-04-01 ENCOUNTER — Ambulatory Visit: Payer: Medicaid Other | Admitting: Podiatry

## 2022-04-09 ENCOUNTER — Ambulatory Visit: Payer: Medicaid Other | Admitting: Podiatry

## 2022-04-09 DIAGNOSIS — B353 Tinea pedis: Secondary | ICD-10-CM

## 2022-04-09 DIAGNOSIS — Z79899 Other long term (current) drug therapy: Secondary | ICD-10-CM | POA: Diagnosis not present

## 2022-04-09 DIAGNOSIS — B351 Tinea unguium: Secondary | ICD-10-CM

## 2022-04-09 MED ORDER — CLOTRIMAZOLE-BETAMETHASONE 1-0.05 % EX CREA: 1.0000 | TOPICAL_CREAM | Freq: Two times a day (BID) | CUTANEOUS | 0 refills | Status: AC

## 2022-04-09 NOTE — Progress Notes (Signed)
Subjective:  Patient ID: Sharon Salazar, female    DOB: [birth year],  MRN: [id number]  Chief Complaint  Patient presents with   Nail Problem    Bilateral hallux nail  Pt stated she thinks it is a fungus     32 y.o. female presents with the above complaint.  Patient presents with bilateral hallux thickened and again dystrophic toenails x 2 mild pain on palpation hurts with ambulation worse with pressure.  She also has athlete's foot to the right side.  She wanted discuss treatment options for it.  Over-the-counter medication topical stuff has not helped.  She denies any other acute complaints.   Review of Systems: Negative except as noted in the HPI. Denies N/V/F/Ch.  Past Medical History:  Diagnosis Date   Anemia    Anxiety    Asthma     Current Outpatient Medications:    clotrimazole-betamethasone (LOTRISONE) cream, Apply 1 Application topically 2 (two) times daily., Disp: 30 g, Rfl: 0   albuterol (VENTOLIN HFA) 108 (90 Base) MCG/ACT inhaler, Inhale 2 puffs into the lungs every 6 (six) hours as needed for wheezing or shortness of breath., Disp: 8 g, Rfl: 0   FLUoxetine (PROZAC) 20 MG capsule, Take 20 mg by mouth daily., Disp: , Rfl:    sertraline (ZOLOFT) 25 MG tablet, Take 25 mg by mouth daily., Disp: , Rfl:   Social History   Tobacco Use  Smoking Status Never  Smokeless Tobacco Never    Allergies  Allergen Reactions   Shellfish Allergy Anaphylaxis and Nausea And Vomiting   Cranberry Itching    All berries   Other Other (See Comments)   Objective:  There were no vitals filed for this visit. There is no height or weight on file to calculate BMI. Constitutional Well developed. Well nourished.  Vascular Dorsalis pedis pulses palpable bilaterally. Posterior tibial pulses palpable bilaterally. Capillary refill normal to all digits.  No cyanosis or clubbing noted. Pedal hair growth normal.  Neurologic Normal speech. Oriented to person, place, and time. Epicritic  sensation to light touch grossly present bilaterally.  Dermatologic Nails thickened elongated dystrophic mycotic toenails x 2 mild pain on palpation. Skin epidermis lysis noted with subjective component which is consistent with athlete's foot on the right side  Orthopedic: Normal joint ROM without pain or crepitus bilaterally. No visible deformities. No bony tenderness.   Radiographs: None Assessment:   1. Long-term use of high-risk medication   2. Nail fungus   3. Onychomycosis due to dermatophyte   4. Tinea pedis of right foot    Plan:  Patient was evaluated and treated and all questions answered.  Bilateral hallux onychomycosis -Educated the patient on the etiology of onychomycosis and various treatment options associated with improving the fungal load.  I explained to the patient that there is 3 treatment options available to treat the onychomycosis including topical, p.o., laser treatment.  Patient elected to undergo p.o. options with Lamisil/terbinafine therapy.  In order for me to start the medication therapy, I explained to the patient the importance of evaluating the liver and obtaining the liver function test.  Once the liver function test comes back normal I will start him on 41-month course of Lamisil therapy.  Patient understood all risk and would like to proceed with Lamisil therapy.  I have asked the patient to immediately stop the Lamisil therapy if she has any reactions to it and call the office or go to the emergency room right away.  Patient states understanding   Right  athlete's foot -I explained to the patient the etiology of athlete's foot and various treatment options were discussed.  I believe she will benefit from Lotrisone cream.  Lotrisone cream was sent to the pharmacy.  No follow-ups on file.

## 2022-04-10 LAB — HEPATIC FUNCTION PANEL
ALT: 13 IU/L (ref 0–32)
AST: 19 IU/L (ref 0–40)
Albumin: 4.2 g/dL (ref 4.0–5.0)
Alkaline Phosphatase: 69 IU/L (ref 44–121)
Bilirubin Total: 0.2 mg/dL (ref 0.0–1.2)
Bilirubin, Direct: <0.1 mg/dL (ref 0.00–0.40)
Total Protein: 6.9 g/dL (ref 6.0–8.5)

## 2022-04-12 MED ORDER — TERBINAFINE HCL 250 MG PO TABS: 250.0000 mg | ORAL_TABLET | Freq: Every day | ORAL | 0 refills | Status: DC

## 2022-04-12 NOTE — Addendum Note (Signed)
Addended by: Livianna Petraglia on: 04/12/2022 12:53 PM   Modules accepted: Orders  

## 2022-04-19 DIAGNOSIS — J4520 Mild intermittent asthma, uncomplicated: Secondary | ICD-10-CM | POA: Diagnosis not present

## 2022-04-19 DIAGNOSIS — J301 Allergic rhinitis due to pollen: Secondary | ICD-10-CM | POA: Diagnosis not present

## 2022-04-19 DIAGNOSIS — N762 Acute vulvitis: Secondary | ICD-10-CM | POA: Diagnosis not present

## 2022-04-19 DIAGNOSIS — J309 Allergic rhinitis, unspecified: Secondary | ICD-10-CM | POA: Diagnosis not present

## 2022-04-19 DIAGNOSIS — N763 Subacute and chronic vulvitis: Secondary | ICD-10-CM | POA: Diagnosis not present

## 2022-04-19 DIAGNOSIS — N760 Acute vaginitis: Secondary | ICD-10-CM | POA: Diagnosis not present

## 2022-04-19 DIAGNOSIS — F3131 Bipolar disorder, current episode depressed, mild: Secondary | ICD-10-CM | POA: Diagnosis not present

## 2022-04-19 DIAGNOSIS — N9489 Other specified conditions associated with female genital organs and menstrual cycle: Secondary | ICD-10-CM | POA: Diagnosis not present

## 2022-04-19 DIAGNOSIS — F419 Anxiety disorder, unspecified: Secondary | ICD-10-CM | POA: Diagnosis not present

## 2022-04-21 IMAGING — US US EXTREM LOW VENOUS*L*
1 series · 14 of 24 positions shown · non-contrast
Comparison: None.

CLINICAL DATA: Left lower extremity pain for the past week

EXAM:
LEFT LOWER EXTREMITY VENOUS DOPPLER ULTRASOUND
TECHNIQUE: Gray-scale sonography with compression, as well as color and duplex
ultrasound, were performed to evaluate the deep venous system(s)
from the level of the common femoral vein through the popliteal and
proximal calf veins.

[Series 1: us venous img lower uni left (dvt) · portal-venous · 14 of 34 slices shown]
[im 1/34]
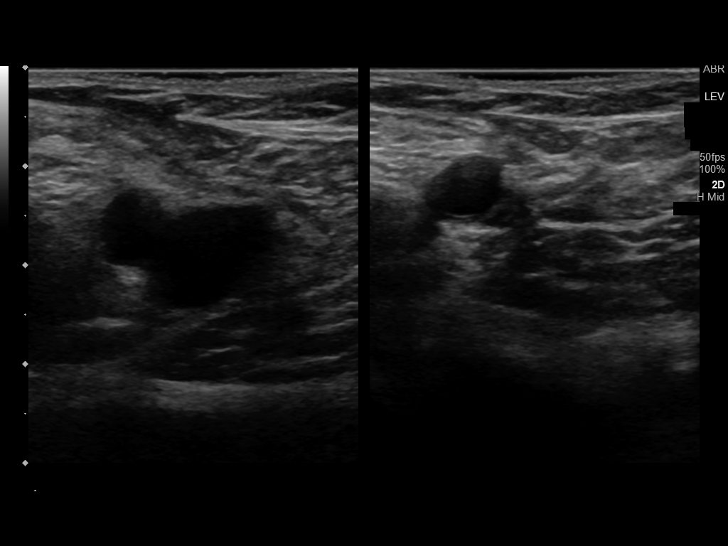
[im 3/34]
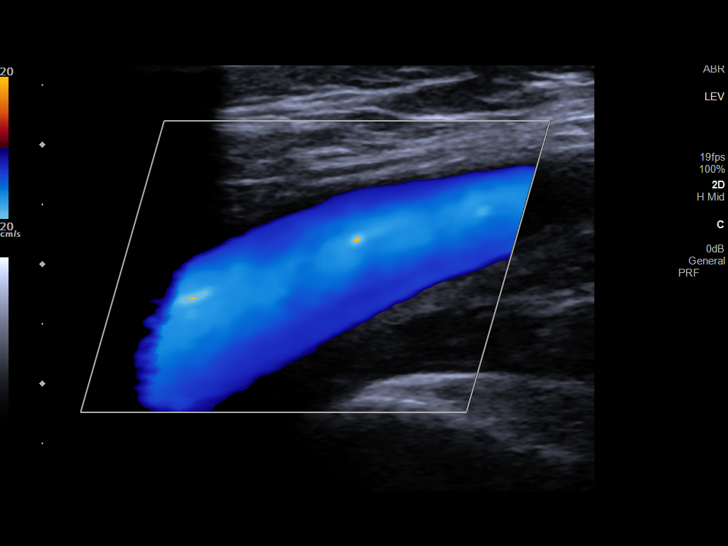
[im 6/34]
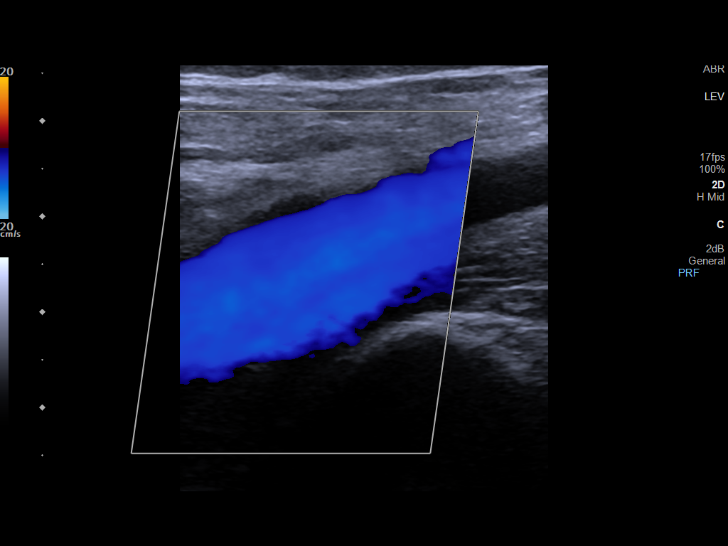
[im 9/34]
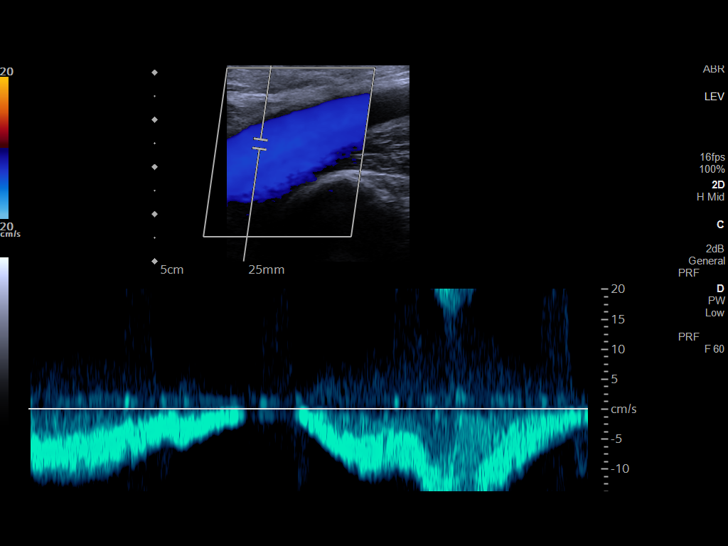
[im 11/34]
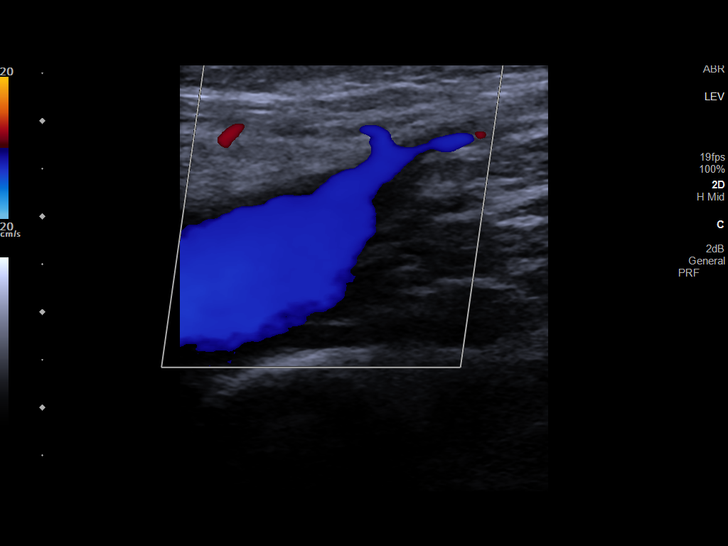
[im 13/34]
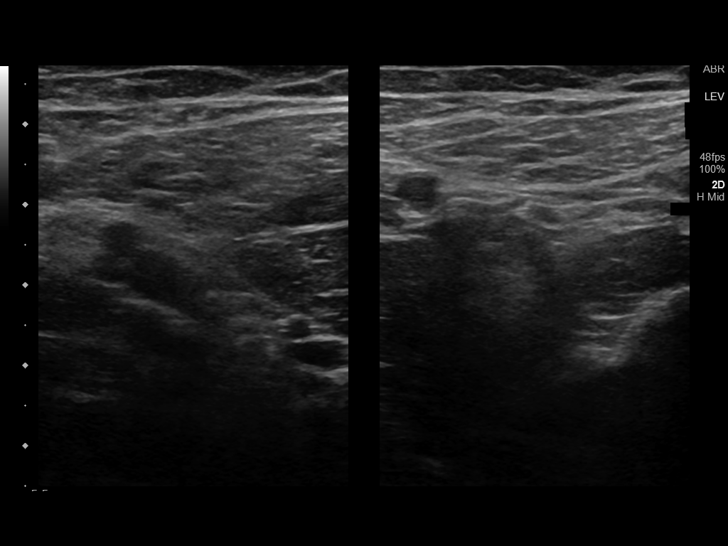
[im 16/34]
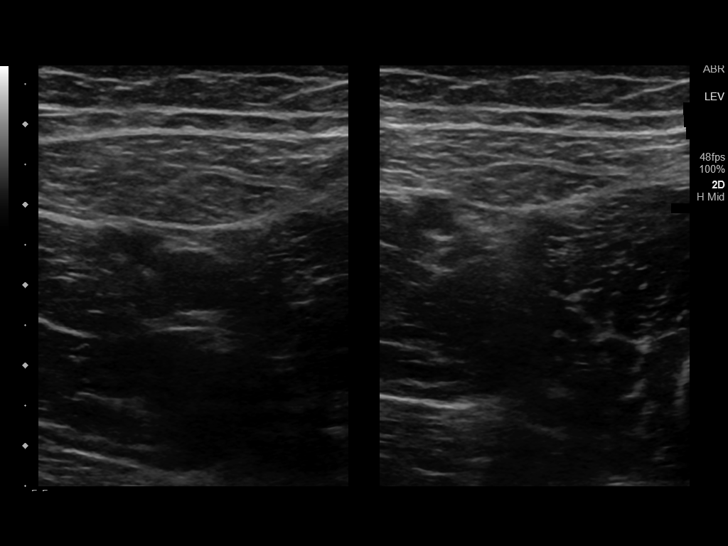
[im 18/34]
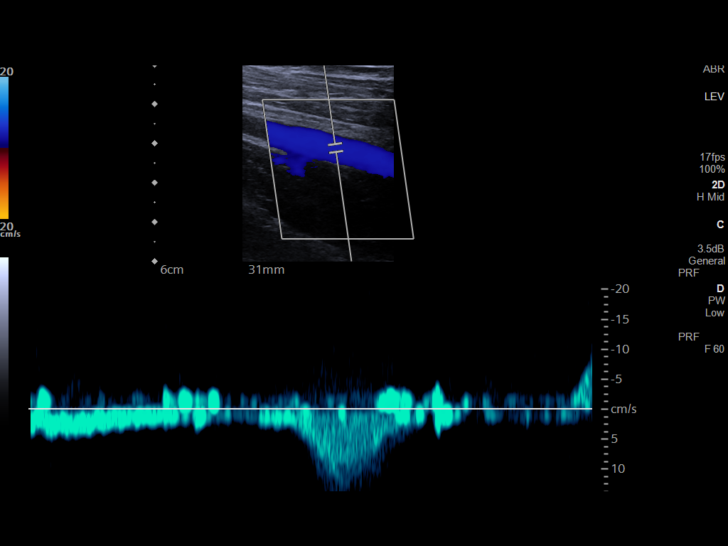
[im 21/34]
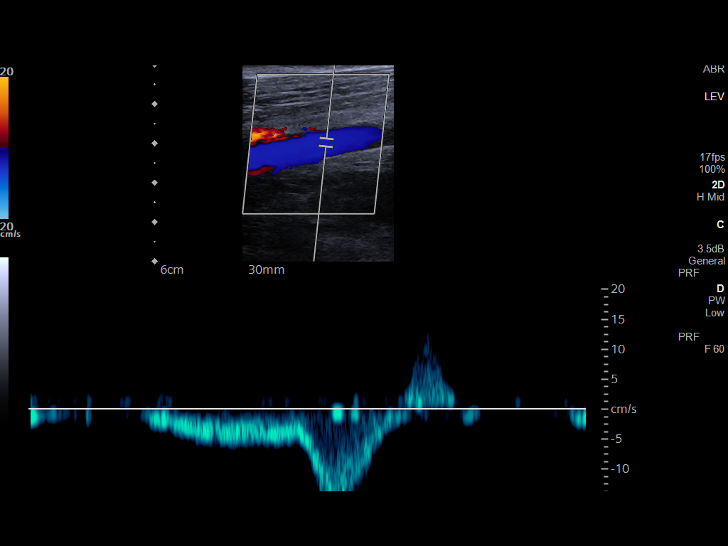
[im 23/34]
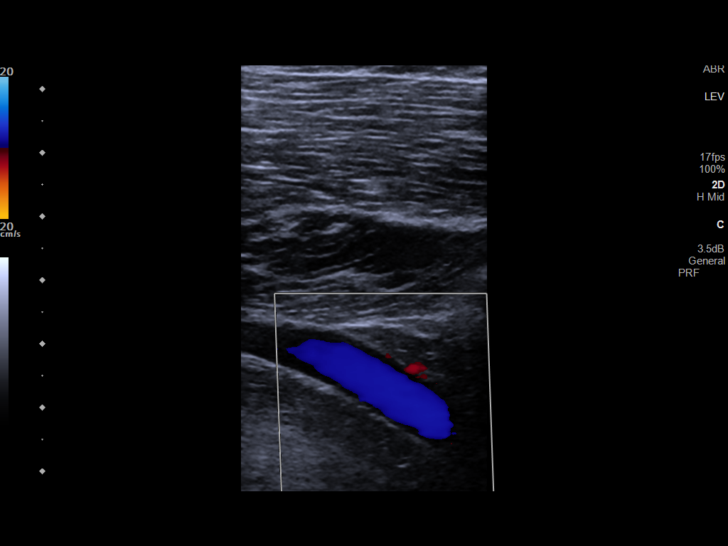
[im 26/34]
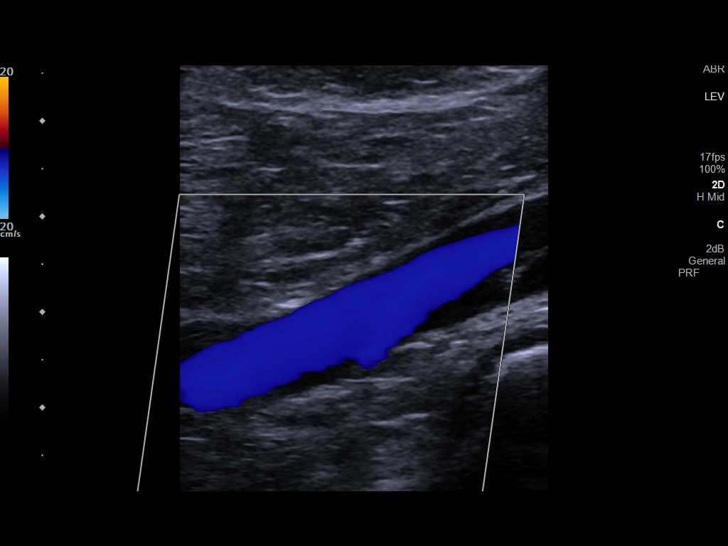
[im 28/34]
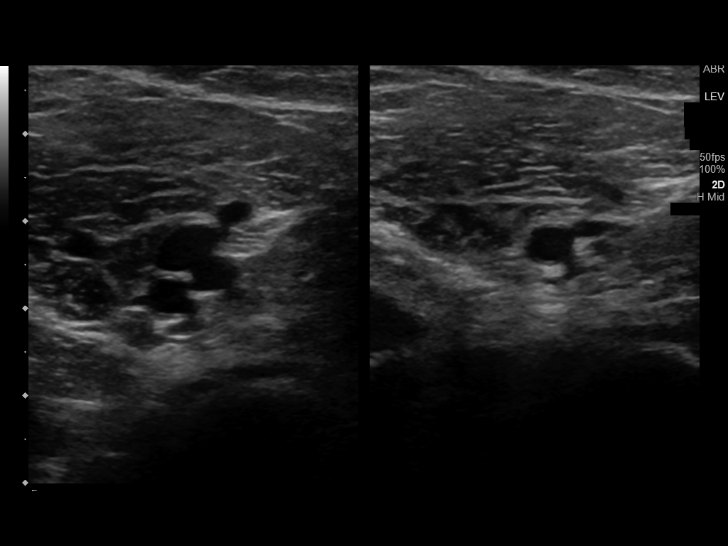
[im 31/34]
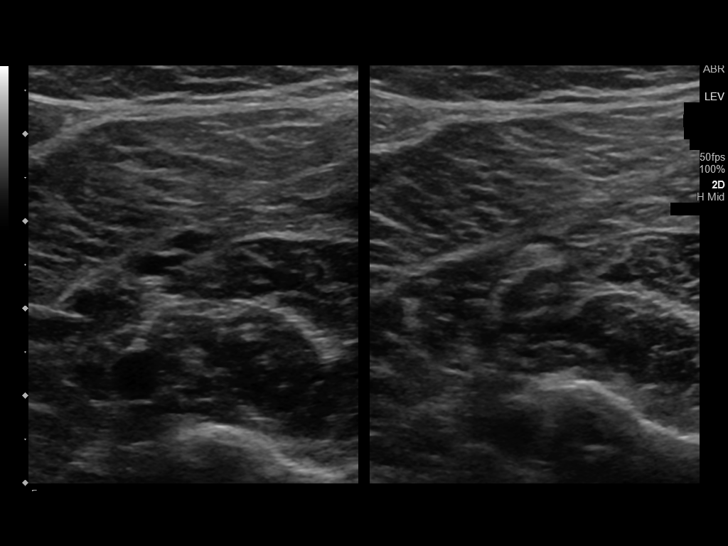
[im 34/34]
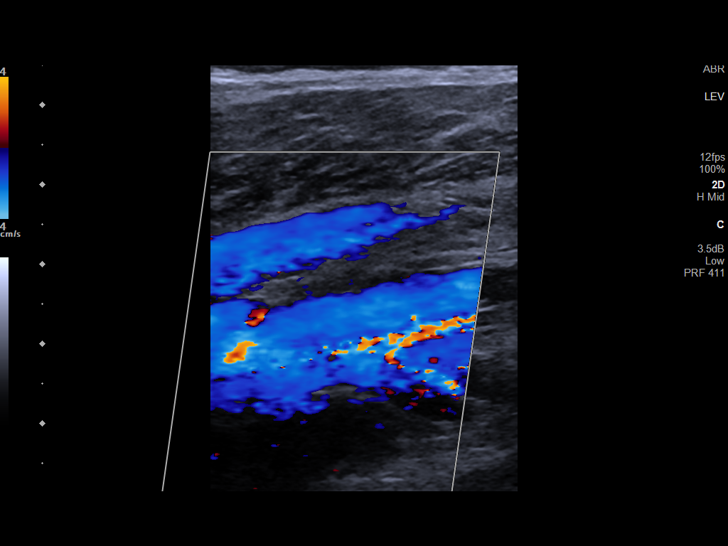

[14 of 24 positions shown; findings below may reference images not displayed]

FINDINGS: VENOUS

Normal compressibility of the common femoral, superficial femoral,
and popliteal veins, as well as the visualized calf veins.
Visualized portions of profunda femoral vein and great saphenous
vein unremarkable. No filling defects to suggest DVT on grayscale or
color Doppler imaging. Doppler waveforms show normal direction of
venous flow, normal respiratory plasticity and response to
augmentation.

Limited views of the contralateral common femoral vein are
unremarkable.

OTHER

None.

Limitations: none
IMPRESSION: Negative.

## 2022-04-29 DIAGNOSIS — Z419 Encounter for procedure for purposes other than remedying health state, unspecified: Secondary | ICD-10-CM | POA: Diagnosis not present

## 2022-05-19 ENCOUNTER — Other Ambulatory Visit: Payer: Self-pay

## 2022-05-19 ENCOUNTER — Encounter: Payer: Self-pay | Admitting: Oncology

## 2022-05-19 DIAGNOSIS — A048 Other specified bacterial intestinal infections: Secondary | ICD-10-CM | POA: Diagnosis not present

## 2022-05-19 DIAGNOSIS — L409 Psoriasis, unspecified: Secondary | ICD-10-CM | POA: Diagnosis not present

## 2022-05-19 DIAGNOSIS — R140 Abdominal distension (gaseous): Secondary | ICD-10-CM

## 2022-05-19 DIAGNOSIS — R110 Nausea: Secondary | ICD-10-CM

## 2022-05-19 DIAGNOSIS — D509 Iron deficiency anemia, unspecified: Secondary | ICD-10-CM | POA: Diagnosis not present

## 2022-05-19 DIAGNOSIS — D649 Anemia, unspecified: Secondary | ICD-10-CM | POA: Diagnosis not present

## 2022-05-19 DIAGNOSIS — M5489 Other dorsalgia: Secondary | ICD-10-CM | POA: Diagnosis not present

## 2022-05-19 DIAGNOSIS — Z0000 Encounter for general adult medical examination without abnormal findings: Secondary | ICD-10-CM | POA: Diagnosis not present

## 2022-05-19 DIAGNOSIS — R7989 Other specified abnormal findings of blood chemistry: Secondary | ICD-10-CM | POA: Diagnosis not present

## 2022-05-20 ENCOUNTER — Telehealth: Payer: Self-pay

## 2022-05-20 NOTE — Telephone Encounter (Signed)
Ok to schedule next available with labs 1-2 days prior to MD/ venofer.  Message sent via staff message as well.

## 2022-05-25 ENCOUNTER — Ambulatory Visit: Admission: RE | Admit: 2022-05-25 | Payer: Medicaid Other | Source: Ambulatory Visit

## 2022-05-28 DIAGNOSIS — Z419 Encounter for procedure for purposes other than remedying health state, unspecified: Secondary | ICD-10-CM | POA: Diagnosis not present

## 2022-06-16 ENCOUNTER — Encounter: Payer: Self-pay | Admitting: Emergency Medicine

## 2022-06-16 ENCOUNTER — Other Ambulatory Visit: Payer: Self-pay

## 2022-06-16 ENCOUNTER — Emergency Department
Admission: EM | Admit: 2022-06-16 | Discharge: 2022-06-16 | Disposition: A | Discharge status: Home / Self Care | Payer: Medicaid Other | Attending: Emergency Medicine | Admitting: Emergency Medicine

## 2022-06-16 DIAGNOSIS — N939 Abnormal uterine and vaginal bleeding, unspecified: Secondary | ICD-10-CM | POA: Diagnosis not present

## 2022-06-16 DIAGNOSIS — R1030 Lower abdominal pain, unspecified: Secondary | ICD-10-CM | POA: Insufficient documentation

## 2022-06-16 DIAGNOSIS — J45909 Unspecified asthma, uncomplicated: Secondary | ICD-10-CM | POA: Insufficient documentation

## 2022-06-16 DIAGNOSIS — N390 Urinary tract infection, site not specified: Secondary | ICD-10-CM

## 2022-06-16 LAB — COMPREHENSIVE METABOLIC PANEL
ALT: 14 U/L (ref 0–44)
AST: 23 U/L (ref 15–41)
Albumin: 3.8 g/dL (ref 3.5–5.0)
Alkaline Phosphatase: 53 U/L (ref 38–126)
Anion gap: 8 (ref 5–15)
BUN: 7 mg/dL (ref 6–20)
CO2: 24 mmol/L (ref 22–32)
Calcium: 9.1 mg/dL (ref 8.9–10.3)
Chloride: 105 mmol/L (ref 98–111)
Creatinine, Ser: 0.62 mg/dL (ref 0.44–1.00)
GFR, Estimated: >60 mL/min (ref >60)
Glucose, Bld: 92 mg/dL (ref 70–99)
Potassium: 3.9 mmol/L (ref 3.5–5.1)
Sodium: 137 mmol/L (ref 135–145)
Total Bilirubin: 0.5 mg/dL (ref 0.3–1.2)
Total Protein: 7.2 g/dL (ref 6.5–8.1)

## 2022-06-16 LAB — LIPASE, BLOOD: Lipase: 35 U/L (ref 11–51)

## 2022-06-16 LAB — URINALYSIS, ROUTINE W REFLEX MICROSCOPIC
Bilirubin Urine: NEGATIVE
Glucose, UA: NEGATIVE mg/dL
Ketones, ur: NEGATIVE mg/dL
Leukocytes,Ua: NEGATIVE
Nitrite: NEGATIVE
Protein, ur: 100 mg/dL — AB
RBC / HPF: >50 RBC/hpf (ref 0–5)
Specific Gravity, Urine: 1.021 (ref 1.005–1.030)
pH: 6 (ref 5.0–8.0)

## 2022-06-16 LAB — HCG, QUANTITATIVE, PREGNANCY: hCG, Beta Chain, Quant, S: <1 m[IU]/mL (ref <5)

## 2022-06-16 LAB — CBC
HCT: 33.2 % — ABNORMAL LOW (ref 36.0–46.0)
Hemoglobin: 10.1 g/dL — ABNORMAL LOW (ref 12.0–15.0)
MCH: 26.2 pg (ref 26.0–34.0)
MCHC: 30.4 g/dL (ref 30.0–36.0)
MCV: 86.2 fL (ref 80.0–100.0)
Platelets: 253 10*3/uL (ref 150–400)
RBC: 3.85 MIL/uL — ABNORMAL LOW (ref 3.87–5.11)
RDW: 17.1 % — ABNORMAL HIGH (ref 11.5–15.5)
WBC: 4.5 10*3/uL (ref 4.0–10.5)
nRBC: 0 % (ref 0.0–0.2)

## 2022-06-16 MED ORDER — NITROFURANTOIN MONOHYD MACRO 100 MG PO CAPS: 100.0000 mg | ORAL_CAPSULE | Freq: Two times a day (BID) | ORAL | 0 refills | Status: AC

## 2022-06-16 NOTE — ED Provider Notes (Signed)
[facility] Provider Note    Event Date/Time   First MD Initiated Contact with Patient 06/16/22 1137     (approximate)  History   Chief Complaint: Abdominal Pain  HPI  Sharon Salazar is a 32 y.o. female with a past medical history of anemia, anxiety, asthma, presents to the emergency department for vaginal bleeding lower abdominal cramping.  According to the patient she started her menstrual cycle 2 days ago but is not supposed to start it for another 2 days from now.  States she has been experiencing some tingling in her legs and some lower abdominal cramping.  Patient states her main concern is she wanted to make sure she is not having a miscarriage and wanted to make sure she was not pregnant.  Patient denies any nausea vomiting or diarrhea.  No urinary symptoms.  Denies any vaginal discharge.  Physical Exam   Triage Vital Signs: ED Triage Vitals [06/16/22 1049]  Enc Vitals Group     BP 133/68     Pulse Rate 65     Resp 18     Temp 98.4 F (36.9 C)     Temp Source Oral     SpO2 100 %     Weight      Height      Head Circumference      Peak Flow      Pain Score 9     Pain Loc      Pain Edu?      Excl. in [place]?     Most recent vital signs: Vitals:   06/16/22 1049  BP: 133/68  Pulse: 65  Resp: 18  Temp: 98.4 F (36.9 C)  SpO2: 100%    General: Awake, no distress.  CV:  Good peripheral perfusion.  Regular rate and rhythm  Resp:  Normal effort.  Equal breath sounds bilaterally.  Abd:  No distention.  Soft, mild suprapubic tenderness otherwise benign abdomen. Other:  Normal-appearing lower extremities with no edema or tenderness.   ED Results / Procedures / Treatments   MEDICATIONS ORDERED IN ED: Medications - No data to display   IMPRESSION / MDM / [place] / ED COURSE  I reviewed the triage vital signs and the nursing notes.  Patient's presentation is most consistent with acute presentation with potential threat to life  or bodily function.  Patient presents emergency department for lower abdominal cramping and vaginal bleeding but her menstrual cycle is not due to start for several more days.  Overall patient appears well, no distress, reassuring lower extremities no concerning findings on exam.  Reassuring vital signs reassuring physical exam aside some mild suprapubic tenderness.  We will check labs including a quantitative beta-hCG as well as a urine sample.  Will continue to closely monitor.  Patient agreeable to plan of care.  Patient's lab work is reassuring, CBC is normal besides mild anemia, chemistry reassuring with normal results, normal LFTs, normal lipase.  Patient's beta-hCG quantitative level is negative as well.  Patient CBC is normal, chemistry is normal beta-hCG is negative.  Urinalysis does show some white cells and rare bacteria.  Given the patient's suprapubic tenderness we will cover with antibiotics Macrobid twice daily x 7 days.  Patient agreeable to plan of care.  FINAL CLINICAL IMPRESSION(S) / ED DIAGNOSES   Lower abdominal cramping Vaginal bleeding   Note:  This document was prepared using Dragon voice recognition software and may include unintentional dictation errors.   Harvest Dark, MD  06/16/22 1329  

## 2022-06-16 NOTE — ED Triage Notes (Signed)
Patient to ED for lower abd pain. Patient states she is having bilateral leg pain- since Monday. Started her period on Monday.

## 2022-06-17 DIAGNOSIS — D509 Iron deficiency anemia, unspecified: Secondary | ICD-10-CM | POA: Diagnosis not present

## 2022-06-17 DIAGNOSIS — M25562 Pain in left knee: Secondary | ICD-10-CM | POA: Diagnosis not present

## 2022-06-17 DIAGNOSIS — M545 Low back pain, unspecified: Secondary | ICD-10-CM | POA: Diagnosis not present

## 2022-06-17 DIAGNOSIS — G8929 Other chronic pain: Secondary | ICD-10-CM | POA: Diagnosis not present

## 2022-06-17 DIAGNOSIS — D649 Anemia, unspecified: Secondary | ICD-10-CM | POA: Diagnosis not present

## 2022-06-17 DIAGNOSIS — A048 Other specified bacterial intestinal infections: Secondary | ICD-10-CM | POA: Diagnosis not present

## 2022-06-17 DIAGNOSIS — L409 Psoriasis, unspecified: Secondary | ICD-10-CM | POA: Diagnosis not present

## 2022-06-17 DIAGNOSIS — M5489 Other dorsalgia: Secondary | ICD-10-CM | POA: Diagnosis not present

## 2022-06-28 DIAGNOSIS — Z419 Encounter for procedure for purposes other than remedying health state, unspecified: Secondary | ICD-10-CM | POA: Diagnosis not present

## 2022-07-05 DIAGNOSIS — R932 Abnormal findings on diagnostic imaging of liver and biliary tract: Secondary | ICD-10-CM | POA: Diagnosis not present

## 2022-07-05 DIAGNOSIS — R140 Abdominal distension (gaseous): Secondary | ICD-10-CM | POA: Diagnosis not present

## 2022-07-05 DIAGNOSIS — L409 Psoriasis, unspecified: Secondary | ICD-10-CM | POA: Diagnosis not present

## 2022-07-05 DIAGNOSIS — D509 Iron deficiency anemia, unspecified: Secondary | ICD-10-CM | POA: Diagnosis not present

## 2022-07-05 DIAGNOSIS — R110 Nausea: Secondary | ICD-10-CM | POA: Diagnosis not present

## 2022-07-07 ENCOUNTER — Other Ambulatory Visit: Payer: Self-pay

## 2022-07-07 ENCOUNTER — Telehealth: Payer: Self-pay | Admitting: Oncology

## 2022-07-07 DIAGNOSIS — D509 Iron deficiency anemia, unspecified: Secondary | ICD-10-CM

## 2022-07-07 MED FILL — Iron Sucrose Inj 20 MG/ML (Fe Equiv): INTRAVENOUS | Qty: 10 | Status: AC

## 2022-07-07 NOTE — Telephone Encounter (Signed)
PER [place]: add lab prior to pt appt. No need to call pt will just be sent to lab when arrived at clinic

## 2022-07-08 ENCOUNTER — Inpatient Hospital Stay: Payer: Medicaid Other

## 2022-07-08 ENCOUNTER — Inpatient Hospital Stay: Payer: Medicaid Other | Admitting: Oncology

## 2022-07-13 DIAGNOSIS — R21 Rash and other nonspecific skin eruption: Secondary | ICD-10-CM | POA: Diagnosis not present

## 2022-07-13 DIAGNOSIS — M25473 Effusion, unspecified ankle: Secondary | ICD-10-CM | POA: Diagnosis not present

## 2022-07-13 DIAGNOSIS — M2550 Pain in unspecified joint: Secondary | ICD-10-CM | POA: Diagnosis not present

## 2022-07-21 ENCOUNTER — Emergency Department
Admission: EM | Admit: 2022-07-21 | Discharge: 2022-07-21 | Disposition: A | Discharge status: Home / Self Care | Payer: Medicaid Other | Attending: Emergency Medicine | Admitting: Emergency Medicine

## 2022-07-21 ENCOUNTER — Emergency Department: Payer: Medicaid Other

## 2022-07-21 ENCOUNTER — Other Ambulatory Visit: Payer: Self-pay

## 2022-07-21 DIAGNOSIS — M549 Dorsalgia, unspecified: Secondary | ICD-10-CM | POA: Diagnosis not present

## 2022-07-21 DIAGNOSIS — S20212A Contusion of left front wall of thorax, initial encounter: Secondary | ICD-10-CM | POA: Insufficient documentation

## 2022-07-21 DIAGNOSIS — T07XXXA Unspecified multiple injuries, initial encounter: Secondary | ICD-10-CM

## 2022-07-21 DIAGNOSIS — M7918 Myalgia, other site: Secondary | ICD-10-CM

## 2022-07-21 DIAGNOSIS — S1093XA Contusion of unspecified part of neck, initial encounter: Secondary | ICD-10-CM | POA: Diagnosis not present

## 2022-07-21 DIAGNOSIS — S0990XA Unspecified injury of head, initial encounter: Secondary | ICD-10-CM | POA: Diagnosis not present

## 2022-07-21 DIAGNOSIS — S0993XA Unspecified injury of face, initial encounter: Secondary | ICD-10-CM | POA: Diagnosis not present

## 2022-07-21 DIAGNOSIS — S0083XA Contusion of other part of head, initial encounter: Secondary | ICD-10-CM | POA: Diagnosis not present

## 2022-07-21 DIAGNOSIS — R0781 Pleurodynia: Secondary | ICD-10-CM | POA: Diagnosis not present

## 2022-07-21 DIAGNOSIS — S299XXA Unspecified injury of thorax, initial encounter: Secondary | ICD-10-CM | POA: Diagnosis present

## 2022-07-21 LAB — URINALYSIS, ROUTINE W REFLEX MICROSCOPIC
Bilirubin Urine: NEGATIVE
Glucose, UA: NEGATIVE mg/dL
Hgb urine dipstick: NEGATIVE
Ketones, ur: NEGATIVE mg/dL
Leukocytes,Ua: NEGATIVE
Nitrite: NEGATIVE
Protein, ur: NEGATIVE mg/dL
Specific Gravity, Urine: 1.025 (ref 1.005–1.030)
pH: 6 (ref 5.0–8.0)

## 2022-07-21 LAB — POC URINE PREG, ED: Preg Test, Ur: NEGATIVE

## 2022-07-21 MED ORDER — IBUPROFEN 600 MG PO TABS: 600.0000 mg | ORAL_TABLET | Freq: Three times a day (TID) | ORAL | 0 refills | Status: AC | PRN

## 2022-07-21 NOTE — ED Notes (Signed)
Report to kim, rn.  
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
## 2022-07-21 NOTE — Discharge Instructions (Addendum)
Follow-up with your primary care provider if any continued problems.  If you do not have a primary care provider you can follow-up with urgent care.  A list of offices taking new patients is listed on your discharge papers so that she can establish a primary care provider.  Ibuprofen was sent to the pharmacy for you to begin taking with food every 8 hours for inflammation and pain.  You may apply ice to your contusions which should help with pain.

## 2022-07-28 DIAGNOSIS — Z419 Encounter for procedure for purposes other than remedying health state, unspecified: Secondary | ICD-10-CM | POA: Diagnosis not present

## 2022-08-05 ENCOUNTER — Encounter: Payer: Self-pay | Admitting: Emergency Medicine

## 2022-08-05 ENCOUNTER — Other Ambulatory Visit: Payer: Self-pay

## 2022-08-05 ENCOUNTER — Emergency Department
Admission: EM | Admit: 2022-08-05 | Discharge: 2022-08-05 | Disposition: A | Discharge status: Home / Self Care | Payer: Medicaid Other | Attending: Emergency Medicine | Admitting: Emergency Medicine

## 2022-08-05 DIAGNOSIS — M25512 Pain in left shoulder: Secondary | ICD-10-CM | POA: Diagnosis not present

## 2022-08-05 DIAGNOSIS — N939 Abnormal uterine and vaginal bleeding, unspecified: Secondary | ICD-10-CM

## 2022-08-05 DIAGNOSIS — J45909 Unspecified asthma, uncomplicated: Secondary | ICD-10-CM | POA: Insufficient documentation

## 2022-08-05 DIAGNOSIS — D649 Anemia, unspecified: Secondary | ICD-10-CM | POA: Diagnosis not present

## 2022-08-05 LAB — CBC WITH DIFFERENTIAL/PLATELET
Abs Immature Granulocytes: 0.01 10*3/uL (ref 0.00–0.07)
Basophils Absolute: 0.1 10*3/uL (ref 0.0–0.1)
Basophils Relative: 1 %
Eosinophils Absolute: 0.2 10*3/uL (ref 0.0–0.5)
Eosinophils Relative: 4 %
HCT: 32.4 % — ABNORMAL LOW (ref 36.0–46.0)
Hemoglobin: 9.9 g/dL — ABNORMAL LOW (ref 12.0–15.0)
Immature Granulocytes: 0 %
Lymphocytes Relative: 31 %
Lymphs Abs: 1.5 10*3/uL (ref 0.7–4.0)
MCH: 26.4 pg (ref 26.0–34.0)
MCHC: 30.6 g/dL (ref 30.0–36.0)
MCV: 86.4 fL (ref 80.0–100.0)
Monocytes Absolute: 0.4 10*3/uL (ref 0.1–1.0)
Monocytes Relative: 9 %
Neutro Abs: 2.7 10*3/uL (ref 1.7–7.7)
Neutrophils Relative %: 55 %
Platelets: 263 10*3/uL (ref 150–400)
RBC: 3.75 MIL/uL — ABNORMAL LOW (ref 3.87–5.11)
RDW: 16.5 % — ABNORMAL HIGH (ref 11.5–15.5)
WBC: 4.9 10*3/uL (ref 4.0–10.5)
nRBC: 0 % (ref 0.0–0.2)

## 2022-08-05 LAB — URINALYSIS, ROUTINE W REFLEX MICROSCOPIC
Bacteria, UA: NONE SEEN
RBC / HPF: >50 RBC/hpf (ref 0–5)
Specific Gravity, Urine: 1.025 (ref 1.005–1.030)
WBC, UA: NONE SEEN WBC/hpf (ref 0–5)

## 2022-08-05 LAB — COMPREHENSIVE METABOLIC PANEL
ALT: 10 U/L (ref 0–44)
AST: 20 U/L (ref 15–41)
Albumin: 3.9 g/dL (ref 3.5–5.0)
Alkaline Phosphatase: 47 U/L (ref 38–126)
Anion gap: 6 (ref 5–15)
BUN: 7 mg/dL (ref 6–20)
CO2: 24 mmol/L (ref 22–32)
Calcium: 8.7 mg/dL — ABNORMAL LOW (ref 8.9–10.3)
Chloride: 104 mmol/L (ref 98–111)
Creatinine, Ser: 0.79 mg/dL (ref 0.44–1.00)
GFR, Estimated: >60 mL/min (ref >60)
Glucose, Bld: 81 mg/dL (ref 70–99)
Potassium: 3.7 mmol/L (ref 3.5–5.1)
Sodium: 134 mmol/L — ABNORMAL LOW (ref 135–145)
Total Bilirubin: 0.6 mg/dL (ref 0.3–1.2)
Total Protein: 7.1 g/dL (ref 6.5–8.1)

## 2022-08-05 LAB — WET PREP, GENITAL
Clue Cells Wet Prep HPF POC: NONE SEEN
Sperm: NONE SEEN
Trich, Wet Prep: NONE SEEN
WBC, Wet Prep HPF POC: <10 (ref <10)
Yeast Wet Prep HPF POC: NONE SEEN

## 2022-08-05 LAB — PREGNANCY, URINE: Preg Test, Ur: NEGATIVE

## 2022-08-05 LAB — HCG, QUANTITATIVE, PREGNANCY: hCG, Beta Chain, Quant, S: <1 m[IU]/mL (ref <5)

## 2022-08-05 MED ORDER — TRANEXAMIC ACID 650 MG PO TABS: 1300.0000 mg | ORAL_TABLET | Freq: Three times a day (TID) | ORAL | 0 refills | Status: AC

## 2022-08-05 NOTE — Discharge Instructions (Signed)
You were seen in the emergency room today for evaluation of your vaginal bleeding and shoulder pain.  Your shoulder pain could possibly related to a strain, your exam was overall reassuring.  For your vaginal bleeding, your pregnancy test was negative.  I have sent a prescription for a medication that may be able to help with your heavy bleeding.  Please arrange follow-up with OB/GYN for further evaluation of your irregular periods.  Return to the ER for new or worsening symptoms.

## 2022-08-05 NOTE — ED Triage Notes (Signed)
Pt to ED for multiple complaints. Pt states left shoulder pain x 4 days without injury. Pt also states menstral cycle came on 4 days early with heavier bleeding and was told by friend that she could be having a miscarriage. Pt states her periods are always ontime and never this heavy. Has not taken a pregnancy test and unsure if pregnant. No previous miscarriages. No meds taken for pain PTA.

## 2022-08-05 NOTE — ED Provider Notes (Signed)
[facility] Provider Note    Event Date/Time   First MD Initiated Contact with Patient 08/05/22 1138     (approximate)   History   Shoulder Pain and Vaginal Bleeding   HPI  Sharon Salazar is a 32 y.o. female with history of anemia, anxiety, asthma presenting to the emergency department for evaluation of vaginal bleeding.  Patient reports that her menstrual cycle started 4 days earlier than usual and she has been having heavier bleeding.  No large clots, but says she will have episodes of gushing blood after which she will need to change her pad.  She was told that vaginal bleeding could be related to a miscarriage so she presents to the ER.  Similar episode last month.  She is sexually active and not on birth control.  No prior pregnancies.  Additionally reports ongoing shoulder pain, not acutely worsening, no recent trauma    Physical Exam   Triage Vital Signs: ED Triage Vitals  Enc Vitals Group     BP 08/05/22 1102 123/72     Pulse Rate 08/05/22 1102 64     Resp 08/05/22 1102 18     Temp 08/05/22 1102 98.6 F (37 C)     Temp src --      SpO2 08/05/22 1102 100 %     Weight 08/05/22 1059 159 lb (72.1 kg)     Height 08/05/22 1059 5\' 7"  (1.702 m)     Head Circumference --      Peak Flow --      Pain Score 08/05/22 1059 8     Pain Loc --      Pain Edu? --      Excl. in GC? --     Most recent vital signs: Vitals:   08/05/22 1102  BP: 123/72  Pulse: 64  Resp: 18  Temp: 98.6 F (37 C)  SpO2: 100%     General: Awake, interactive  CV:  Regular rate, good peripheral perfusion.  Resp:  Lungs clear, unlabored respirations.  Abd:  Soft, mild suprapubic tenderness, remainder of abdomen nontender, nondistended GU: External genitalia unremarkable. Speculum exam with small amount of blood pooled within the vaginal vault, readily able to be cleared.  Cervix closed without evidence of active bleeding or protruding material.  Bimanual exam without  cervical motion tenderness or adnexal tenderness. Neuro:  Symmetric facial movement, fluid speech MSK:  Full range of motion of bilateral upper extremities including left shoulder without significant tenderness to palpation   ED Results / Procedures / Treatments   Labs (all labs ordered are listed, but only abnormal results are displayed) Labs Reviewed  URINALYSIS, ROUTINE W REFLEX MICROSCOPIC - Abnormal; Notable for the following components:      Result Value   Color, Urine RED (*)    APPearance CLOUDY (*)    Glucose, UA   (*)    Value: TEST NOT REPORTED DUE TO COLOR INTERFERENCE OF URINE PIGMENT   Hgb urine dipstick   (*)    Value: TEST NOT REPORTED DUE TO COLOR INTERFERENCE OF URINE PIGMENT   Bilirubin Urine   (*)    Value: TEST NOT REPORTED DUE TO COLOR INTERFERENCE OF URINE PIGMENT   Ketones, ur   (*)    Value: TEST NOT REPORTED DUE TO COLOR INTERFERENCE OF URINE PIGMENT   Protein, ur   (*)    Value: TEST NOT REPORTED DUE TO COLOR INTERFERENCE OF URINE PIGMENT   Nitrite   (*)  Value: TEST NOT REPORTED DUE TO COLOR INTERFERENCE OF URINE PIGMENT   Leukocytes,Ua   (*)    Value: TEST NOT REPORTED DUE TO COLOR INTERFERENCE OF URINE PIGMENT   All other components within normal limits  CBC WITH DIFFERENTIAL/PLATELET - Abnormal; Notable for the following components:   RBC 3.75 (*)    Hemoglobin 9.9 (*)    HCT 32.4 (*)    RDW 16.5 (*)    All other components within normal limits  COMPREHENSIVE METABOLIC PANEL - Abnormal; Notable for the following components:   Sodium 134 (*)    Calcium 8.7 (*)    All other components within normal limits  WET PREP, GENITAL  PREGNANCY, URINE  HCG, QUANTITATIVE, PREGNANCY     RADIOLOGY  PROCEDURES:  Critical Care performed: No  Procedures   MEDICATIONS ORDERED IN ED: Medications - No data to display   IMPRESSION / MDM / ASSESSMENT AND PLAN / ED COURSE  I reviewed the triage vital signs and the nursing notes.  Differential  diagnosis includes, but is not limited to abnormal uterine bleeding, bleeding in pregnancy secondary to miscarriage, implantation bleeding, regarding shoulder pain low suspicion for significant acute pathology such as fracture or dislocation  Patient's presentation is most consistent with acute illness / injury with system symptoms.  32 year old female presenting with vaginal bleeding a few days earlier than her typical cycle with heavier flow.  No significant active bleeding on pelvic exam.  UPT negative.  Lab work demonstrates stable anemia with hemoglobin of 9.9.  CMP without severe derangement.  Wet prep without evidence of BV or other acute findings.  hCG undetectable.  Presentation seems most consistent with abnormal uterine bleeding.  Hemodynamically stable without significant hemoglobin drop.  Do think patient is stable for discharge home.  She does report that she is interested in getting pregnant and would like to avoid OCPs.  Will discharge on a short course of TXA.  Discussed importance of outpatient follow-up with her primary care doctor and OB/GYN for further evaluation.  Strict return precautions provided.  Patient discharged stable condition.     FINAL CLINICAL IMPRESSION(S) / ED DIAGNOSES   Final diagnoses:  Vaginal bleeding  Acute pain of left shoulder     Rx / DC Orders   ED Discharge Orders          Ordered    tranexamic acid (LYSTEDA) 650 MG TABS tablet  3 times daily        08/05/22 1446             Note:  This document was prepared using Dragon voice recognition software and may include unintentional dictation errors.   Trinna Post, MD 08/05/22 [phone]

## 2022-08-10 ENCOUNTER — Encounter: Payer: Medicaid Other | Admitting: Podiatry

## 2022-08-12 ENCOUNTER — Encounter: Payer: Medicaid Other | Admitting: Podiatry

## 2022-08-13 ENCOUNTER — Inpatient Hospital Stay: Payer: Medicaid Other

## 2022-08-13 ENCOUNTER — Inpatient Hospital Stay: Payer: Medicaid Other | Attending: Oncology

## 2022-08-13 DIAGNOSIS — Z79899 Other long term (current) drug therapy: Secondary | ICD-10-CM | POA: Insufficient documentation

## 2022-08-13 DIAGNOSIS — J45909 Unspecified asthma, uncomplicated: Secondary | ICD-10-CM | POA: Diagnosis not present

## 2022-08-13 DIAGNOSIS — F419 Anxiety disorder, unspecified: Secondary | ICD-10-CM | POA: Insufficient documentation

## 2022-08-13 DIAGNOSIS — F32A Depression, unspecified: Secondary | ICD-10-CM | POA: Diagnosis not present

## 2022-08-13 DIAGNOSIS — D509 Iron deficiency anemia, unspecified: Secondary | ICD-10-CM | POA: Diagnosis not present

## 2022-08-13 LAB — CBC WITH DIFFERENTIAL (CANCER CENTER ONLY)
Abs Immature Granulocytes: 0.01 10*3/uL (ref 0.00–0.07)
Basophils Absolute: 0.1 10*3/uL (ref 0.0–0.1)
Basophils Relative: 1 %
Eosinophils Absolute: 0.2 10*3/uL (ref 0.0–0.5)
Eosinophils Relative: 3 %
HCT: 28.1 % — ABNORMAL LOW (ref 36.0–46.0)
Hemoglobin: 8.7 g/dL — ABNORMAL LOW (ref 12.0–15.0)
Immature Granulocytes: 0 %
Lymphocytes Relative: 34 %
Lymphs Abs: 1.6 10*3/uL (ref 0.7–4.0)
MCH: 26.4 pg (ref 26.0–34.0)
MCHC: 31 g/dL (ref 30.0–36.0)
MCV: 85.4 fL (ref 80.0–100.0)
Monocytes Absolute: 0.4 10*3/uL (ref 0.1–1.0)
Monocytes Relative: 8 %
Neutro Abs: 2.5 10*3/uL (ref 1.7–7.7)
Neutrophils Relative %: 54 %
Platelet Count: 294 10*3/uL (ref 150–400)
RBC: 3.29 MIL/uL — ABNORMAL LOW (ref 3.87–5.11)
RDW: 15.9 % — ABNORMAL HIGH (ref 11.5–15.5)
WBC Count: 4.7 10*3/uL (ref 4.0–10.5)
nRBC: 0 % (ref 0.0–0.2)

## 2022-08-13 LAB — IRON AND TIBC
Iron: 20 ug/dL — ABNORMAL LOW (ref 28–170)
Saturation Ratios: 5 % — ABNORMAL LOW (ref 10.4–31.8)
TIBC: 424 ug/dL (ref 250–450)
UIBC: 404 ug/dL

## 2022-08-13 LAB — FERRITIN: Ferritin: 3 ng/mL — ABNORMAL LOW (ref 11–307)

## 2022-08-17 ENCOUNTER — Encounter: Payer: Medicaid Other | Admitting: Podiatry

## 2022-08-17 DIAGNOSIS — L409 Psoriasis, unspecified: Secondary | ICD-10-CM | POA: Diagnosis not present

## 2022-08-17 DIAGNOSIS — M545 Low back pain, unspecified: Secondary | ICD-10-CM | POA: Diagnosis not present

## 2022-08-17 DIAGNOSIS — K047 Periapical abscess without sinus: Secondary | ICD-10-CM | POA: Diagnosis not present

## 2022-08-17 DIAGNOSIS — G8929 Other chronic pain: Secondary | ICD-10-CM | POA: Diagnosis not present

## 2022-08-17 DIAGNOSIS — R140 Abdominal distension (gaseous): Secondary | ICD-10-CM | POA: Diagnosis not present

## 2022-08-17 DIAGNOSIS — K760 Fatty (change of) liver, not elsewhere classified: Secondary | ICD-10-CM | POA: Diagnosis not present

## 2022-08-17 DIAGNOSIS — D509 Iron deficiency anemia, unspecified: Secondary | ICD-10-CM | POA: Diagnosis not present

## 2022-08-17 MED FILL — Iron Sucrose Inj 20 MG/ML (Fe Equiv): INTRAVENOUS | Qty: 10 | Status: AC

## 2022-08-18 ENCOUNTER — Inpatient Hospital Stay: Payer: Medicaid Other

## 2022-08-18 ENCOUNTER — Encounter: Payer: Self-pay | Admitting: Oncology

## 2022-08-18 ENCOUNTER — Inpatient Hospital Stay (HOSPITAL_BASED_OUTPATIENT_CLINIC_OR_DEPARTMENT_OTHER): Payer: Medicaid Other | Admitting: Oncology

## 2022-08-18 VITALS — BP 107/66 | HR 76 | Temp 97.2°F | Resp 18 | Wt 154.4 lb

## 2022-08-18 DIAGNOSIS — D509 Iron deficiency anemia, unspecified: Secondary | ICD-10-CM

## 2022-08-18 DIAGNOSIS — F32A Depression, unspecified: Secondary | ICD-10-CM | POA: Diagnosis not present

## 2022-08-18 DIAGNOSIS — J45909 Unspecified asthma, uncomplicated: Secondary | ICD-10-CM | POA: Diagnosis not present

## 2022-08-18 DIAGNOSIS — F419 Anxiety disorder, unspecified: Secondary | ICD-10-CM | POA: Diagnosis not present

## 2022-08-18 DIAGNOSIS — Z79899 Other long term (current) drug therapy: Secondary | ICD-10-CM | POA: Diagnosis not present

## 2022-08-18 NOTE — Assessment & Plan Note (Addendum)
Labs are reviewed and discussed with patient. Lab Results  Component Value Date   HGB 8.7 (L) 08/13/2022   TIBC 424 08/13/2022   IRONPCTSAT 5 (L) 08/13/2022   FERRITIN 3 (L) 08/13/2022    Recommend IV Venofer treatments.  She previously tolerated well.  Recommend IV Venofer weekly x 5. Patient is sexually active and not on any contraceptives.  Recommend pregnancy testing prior to Venofer treatments. Etiology of iron deficiency, unknown.

## 2022-08-18 NOTE — Progress Notes (Signed)
Hematology/Oncology Consult note [facility] [phone] [phone]   Patient Care Team: [name], MD as PCP - General (Infectious Diseases)  CHIEF COMPLAINTS/REASON FOR VISIT:  Iron deficiency anemia  ASSESSMENT & PLAN:   Iron deficiency anemia Labs are reviewed and discussed with patient. Lab Results  Component Value Date   HGB 8.7 (L) 08/13/2022   TIBC 424 08/13/2022   IRONPCTSAT 5 (L) 08/13/2022   FERRITIN 3 (L) 08/13/2022    Recommend IV Venofer treatments.  She previously tolerated well.  Recommend IV Venofer weekly x 5. Patient is sexually active and not on any contraceptives.  Recommend pregnancy testing prior to Venofer treatments. Etiology of iron deficiency, unknown.   Orders Placed This Encounter  Procedures   CBC with Differential (Cancer Center Only)    Standing Status:   Future    Standing Expiration Date:   08/18/2023   Iron and TIBC    Standing Status:   Future    Standing Expiration Date:   08/18/2023   Ferritin    Standing Status:   Future    Standing Expiration Date:   08/18/2023   Retic Panel    Standing Status:   Future    Standing Expiration Date:   08/18/2023   Pregnancy, urine    Standing Status:   Future    Standing Expiration Date:   08/18/2023   Pregnancy, urine    Standing Status:   Future    Standing Expiration Date:   08/18/2023   Pregnancy, urine    Standing Status:   Future    Standing Expiration Date:   08/18/2023   Pregnancy, urine    Standing Status:   Future    Standing Expiration Date:   08/18/2023   Follow-up in 3 months. All questions were answered. The patient knows to call the clinic with any problems, questions or concerns.  [name], MD, PhD [facility] Hematology Oncology 08/18/2022   HISTORY OF PRESENTING ILLNESS:   Sharon Salazar is a  32 y.o.  female with PMH listed below was seen in consultation at the request of  Armando Gang, FNP  for evaluation of  anemia  Patient has chronic anemia.  She reports previous history of receiving either blood transfusion or iron infusion back in 2015 after she cut her left arm accidentally. She takes oral iron supplementation intermittently. Denies any bloody or tarry stool.  She reports that her menstrual period has been light.  Usually lasts 4 to 5 days.  On the heaviest day, she only uses 1 or 2 pads during the day. She describes some epigastric "folding discomfort" after eating. Per patient, she feels extremely hungry after eating.  Her primary care provider Dr. Sampson Goon plans to check parasites.  She was informed that the test kit will arrive in the mail.  Other chronic medical problems include psoriasis with chronic arthralgia and myalgia.  Depression/anxiety, asthma.  She has been advised to take meloxicam as needed for inflammation and pain.  On Zoloft for mood disorder.  Trazodone for sleep.  INTERVAL HISTORY Sharon Salazar is a 32 y.o. female who has above history reviewed by me today presents for follow up visit for iron deficiency anemia. Patient was last seen by me virtually in October 2022.  Lost follow-up afterwards.  She presents to reestablish care today. Patient has recent blood work done on 08/13/2022 which showed hemoglobin of 8.7, MCV 85.4, iron saturation 5, TIBC 424, ferritin 3. Patient denies any heavy menstrual  bleeding. Denies hematochezia, hematuria, hematemesis, epistaxis, black tarry stool or easy bruising.    Review of Systems  Constitutional:  Positive for fatigue. Negative for appetite change, chills and fever.  HENT:   Negative for hearing loss and voice change.   Eyes:  Negative for eye problems.  Respiratory:  Negative for chest tightness and cough.   Cardiovascular:  Negative for chest pain.  Gastrointestinal:  Negative for abdominal distention, abdominal pain and blood in stool.  Endocrine: Negative for hot flashes.  Genitourinary:  Negative for difficulty urinating and  frequency.   Musculoskeletal:  Positive for arthralgias.  Skin:  Negative for itching and rash.  Neurological:  Negative for extremity weakness.  Hematological:  Negative for adenopathy.  Psychiatric/Behavioral:  Negative for confusion.     MEDICAL HISTORY:  Past Medical History:  Diagnosis Date   Anemia    Anxiety    Asthma     SURGICAL HISTORY: Past Surgical History:  Procedure Laterality Date   left arm surgery Left    tonsillectcomy      SOCIAL HISTORY: Social History   Socioeconomic History   Marital status: Single    Spouse name: Not on file   Number of children: Not on file   Years of education: Not on file   Highest education level: Not on file  Occupational History   Not on file  Tobacco Use   Smoking status: Never   Smokeless tobacco: Never  Vaping Use   Vaping Use: Never used  Substance and Sexual Activity   Alcohol use: Not Currently    Comment: occasional   Drug use: Yes    Types: Marijuana   Sexual activity: Yes  Other Topics Concern   Not on file  Social History Narrative   Not on file   Social Determinants of Health   Financial Resource Strain: Not on file  Food Insecurity: Not on file  Transportation Needs: Not on file  Physical Activity: Not on file  Stress: Not on file  Social Connections: Not on file  Intimate Partner Violence: Not on file    FAMILY HISTORY: Family History  Problem Relation Age of Onset   Anemia Mother    Sarcoidosis Maternal Grandmother     ALLERGIES:  is allergic to shellfish allergy, cranberry, and other.  MEDICATIONS:  Current Outpatient Medications  Medication Sig Dispense Refill   albuterol (VENTOLIN HFA) 108 (90 Base) MCG/ACT inhaler Inhale 2 puffs into the lungs every 6 (six) hours as needed for wheezing or shortness of breath. 8 g 0   clotrimazole-betamethasone (LOTRISONE) cream Apply 1 Application topically 2 (two) times daily. 30 g 0   FLUoxetine (PROZAC) 20 MG capsule Take 20 mg by mouth daily.      ibuprofen (ADVIL) 600 MG tablet Take 1 tablet (600 mg total) by mouth every 8 (eight) hours as needed. With food 30 tablet 0   sertraline (ZOLOFT) 25 MG tablet Take 25 mg by mouth daily.     terbinafine (LAMISIL) 250 MG tablet Take 1 tablet (250 mg total) by mouth daily. 90 tablet 0   Vitamin D, Ergocalciferol, (DRISDOL) 1.25 MG (50000 UNIT) CAPS capsule Take 50,000 Units by mouth every 7 (seven) days.     amoxicillin (AMOXIL) 875 MG tablet Take 875 mg by mouth 2 (two) times daily. (Patient not taking: Reported on 08/18/2022)     No current facility-administered medications for this visit.     PHYSICAL EXAMINATION: ECOG PERFORMANCE STATUS: 1 - Symptomatic but completely ambulatory Vitals:  08/18/22 1411  BP: 107/66  Pulse: 76  Resp: 18  Temp: (!) 97.2 F (36.2 C)   Filed Weights   08/18/22 1411  Weight: 154 lb 6.4 oz (70 kg)    Physical Exam Constitutional:      General: She is not in acute distress. HENT:     Head: Normocephalic and atraumatic.  Eyes:     General: No scleral icterus. Cardiovascular:     Rate and Rhythm: Normal rate and regular rhythm.     Heart sounds: Normal heart sounds.  Pulmonary:     Effort: Pulmonary effort is normal. No respiratory distress.     Breath sounds: No wheezing.  Abdominal:     General: Bowel sounds are normal. There is no distension.     Palpations: Abdomen is soft.  Musculoskeletal:        General: No deformity. Normal range of motion.     Cervical back: Normal range of motion and neck supple.  Skin:    General: Skin is warm and dry.     Findings: No erythema or rash.  Neurological:     Mental Status: She is alert and oriented to person, place, and time. Mental status is at baseline.     Cranial Nerves: No cranial nerve deficit.     Coordination: Coordination normal.  Psychiatric:        Mood and Affect: Mood normal.     LABORATORY DATA:  I have reviewed the data as listed Lab Results  Component Value Date   WBC  4.7 08/13/2022   HGB 8.7 (L) 08/13/2022   HCT 28.1 (L) 08/13/2022   MCV 85.4 08/13/2022   PLT 294 08/13/2022   Recent Labs    09/03/21 0808 04/09/22 1051 06/16/22 1050 08/05/22 1247  NA 139  --  137 134*  K 3.5  --  3.9 3.7  CL 109  --  105 104  CO2 26  --  24 24  GLUCOSE 90  --  92 81  BUN 8  --  7 7  CREATININE 0.66  --  0.62 0.79  CALCIUM 8.9  --  9.1 8.7*  GFRNONAA >60  --  >60 >60  PROT 7.1 6.9 7.2 7.1  ALBUMIN 3.7 4.2 3.8 3.9  AST 20 19 23 20   ALT 14 13 14 10   ALKPHOS 65 69 53 47  BILITOT 0.6 0.2 0.5 0.6  BILIDIR  --  <0.10  --   --     Iron/TIBC/Ferritin/ %Sat    Component Value Date/Time   IRON 20 (L) 08/13/2022 1204   TIBC 424 08/13/2022 1204   FERRITIN 3 (L) 08/13/2022 1204   IRONPCTSAT 5 (L) 08/13/2022 1204      RADIOGRAPHIC STUDIES: I have personally reviewed the radiological images as listed and agreed with the findings in the report. DG Ribs Unilateral W/Chest Left  Result Date: 07/21/2022 CLINICAL DATA:  Trauma, left-sided rib pain EXAM: LEFT RIBS AND CHEST - 4 VIEW COMPARISON:  02/08/2022 chest radiograph FINDINGS: No displace fracture or other bone lesions are seen involving the ribs. There is no evidence of pneumothorax or pleural effusion. Both lungs are clear. Heart size and mediastinal contours are within normal limits. IMPRESSION: No displaced rib fracture.  No acute cardiopulmonary process. Electronically Signed   By: Wiliam Ke M.D.   On: 07/21/2022 11:12   CT Head Wo Contrast  Result Date: 07/21/2022 CLINICAL DATA:  Blunt facial trauma EXAM: CT HEAD WITHOUT CONTRAST CT MAXILLOFACIAL WITHOUT CONTRAST CT  CERVICAL SPINE WITHOUT CONTRAST TECHNIQUE: Multidetector CT imaging of the head, cervical spine, and maxillofacial structures were performed using the standard protocol without intravenous contrast. Multiplanar CT image reconstructions of the cervical spine and maxillofacial structures were also generated. RADIATION DOSE REDUCTION: This exam  was performed according to the departmental dose-optimization program which includes automated exposure control, adjustment of the mA and/or kV according to patient size and/or use of iterative reconstruction technique. COMPARISON:  Maxillofacial CT 12/24/2018 FINDINGS: CT HEAD FINDINGS Brain: No evidence of swelling, infarction, hemorrhage, hydrocephalus, extra-axial collection or mass lesion/mass effect. Vascular: No hyperdense vessel or unexpected calcification. Skull: Normal. Negative for fracture or focal lesion. Other: None. CT MAXILLOFACIAL FINDINGS Osseous: No fracture or mandibular dislocation. No destructive process. Critical lucency around a carious right lower premolar. Orbits: Negative Sinuses: Mild mucosal thickening along the floors of the maxillary sinuses. Soft tissues: No hematoma or foreign body CT CERVICAL SPINE FINDINGS Alignment: Normal. Skull base and vertebrae: C4-5 non segmentation. Soft tissues and spinal canal: No prevertebral fluid or swelling. No visible canal hematoma. Disc levels:  Negative Upper chest: Negative IMPRESSION: No evidence of intracranial or cervical spine injury. Negative for facial fracture. Electronically Signed   By: Tiburcio Pea M.D.   On: 07/21/2022 11:04   CT Cervical Spine Wo Contrast  Result Date: 07/21/2022 CLINICAL DATA:  Blunt facial trauma EXAM: CT HEAD WITHOUT CONTRAST CT MAXILLOFACIAL WITHOUT CONTRAST CT CERVICAL SPINE WITHOUT CONTRAST TECHNIQUE: Multidetector CT imaging of the head, cervical spine, and maxillofacial structures were performed using the standard protocol without intravenous contrast. Multiplanar CT image reconstructions of the cervical spine and maxillofacial structures were also generated. RADIATION DOSE REDUCTION: This exam was performed according to the departmental dose-optimization program which includes automated exposure control, adjustment of the mA and/or kV according to patient size and/or use of iterative reconstruction  technique. COMPARISON:  Maxillofacial CT 12/24/2018 FINDINGS: CT HEAD FINDINGS Brain: No evidence of swelling, infarction, hemorrhage, hydrocephalus, extra-axial collection or mass lesion/mass effect. Vascular: No hyperdense vessel or unexpected calcification. Skull: Normal. Negative for fracture or focal lesion. Other: None. CT MAXILLOFACIAL FINDINGS Osseous: No fracture or mandibular dislocation. No destructive process. Critical lucency around a carious right lower premolar. Orbits: Negative Sinuses: Mild mucosal thickening along the floors of the maxillary sinuses. Soft tissues: No hematoma or foreign body CT CERVICAL SPINE FINDINGS Alignment: Normal. Skull base and vertebrae: C4-5 non segmentation. Soft tissues and spinal canal: No prevertebral fluid or swelling. No visible canal hematoma. Disc levels:  Negative Upper chest: Negative IMPRESSION: No evidence of intracranial or cervical spine injury. Negative for facial fracture. Electronically Signed   By: Tiburcio Pea M.D.   On: 07/21/2022 11:04   CT MAXILLOFACIAL WO CONTRAST  Result Date: 07/21/2022 CLINICAL DATA:  Blunt facial trauma EXAM: CT HEAD WITHOUT CONTRAST CT MAXILLOFACIAL WITHOUT CONTRAST CT CERVICAL SPINE WITHOUT CONTRAST TECHNIQUE: Multidetector CT imaging of the head, cervical spine, and maxillofacial structures were performed using the standard protocol without intravenous contrast. Multiplanar CT image reconstructions of the cervical spine and maxillofacial structures were also generated. RADIATION DOSE REDUCTION: This exam was performed according to the departmental dose-optimization program which includes automated exposure control, adjustment of the mA and/or kV according to patient size and/or use of iterative reconstruction technique. COMPARISON:  Maxillofacial CT 12/24/2018 FINDINGS: CT HEAD FINDINGS Brain: No evidence of swelling, infarction, hemorrhage, hydrocephalus, extra-axial collection or mass lesion/mass effect. Vascular: No  hyperdense vessel or unexpected calcification. Skull: Normal. Negative for fracture or focal lesion. Other: None. CT MAXILLOFACIAL FINDINGS  Osseous: No fracture or mandibular dislocation. No destructive process. Critical lucency around a carious right lower premolar. Orbits: Negative Sinuses: Mild mucosal thickening along the floors of the maxillary sinuses. Soft tissues: No hematoma or foreign body CT CERVICAL SPINE FINDINGS Alignment: Normal. Skull base and vertebrae: C4-5 non segmentation. Soft tissues and spinal canal: No prevertebral fluid or swelling. No visible canal hematoma. Disc levels:  Negative Upper chest: Negative IMPRESSION: No evidence of intracranial or cervical spine injury. Negative for facial fracture. Electronically Signed   By: Tiburcio Pea M.D.   On: 07/21/2022 11:04

## 2022-08-20 ENCOUNTER — Inpatient Hospital Stay: Payer: Medicaid Other

## 2022-08-20 VITALS — BP 94/61 | HR 61 | Temp 97.5°F | Resp 16

## 2022-08-20 DIAGNOSIS — F32A Depression, unspecified: Secondary | ICD-10-CM | POA: Diagnosis not present

## 2022-08-20 DIAGNOSIS — D509 Iron deficiency anemia, unspecified: Secondary | ICD-10-CM | POA: Diagnosis not present

## 2022-08-20 DIAGNOSIS — J45909 Unspecified asthma, uncomplicated: Secondary | ICD-10-CM | POA: Diagnosis not present

## 2022-08-20 DIAGNOSIS — F419 Anxiety disorder, unspecified: Secondary | ICD-10-CM | POA: Diagnosis not present

## 2022-08-20 DIAGNOSIS — Z79899 Other long term (current) drug therapy: Secondary | ICD-10-CM | POA: Diagnosis not present

## 2022-08-20 MED ORDER — SODIUM CHLORIDE 0.9 % IV SOLN
Freq: Once | INTRAVENOUS | Status: AC
  Filled 2022-08-20: qty 250

## 2022-08-20 MED ORDER — SODIUM CHLORIDE 0.9 % IV SOLN
200.0000 mg | Freq: Once | INTRAVENOUS | Status: AC
  Administered 2022-08-20: 200 mg via INTRAVENOUS
  Filled 2022-08-20: qty 200

## 2022-08-25 ENCOUNTER — Inpatient Hospital Stay: Payer: Medicaid Other

## 2022-08-25 VITALS — BP 123/67 | HR 65 | Temp 97.9°F | Resp 16

## 2022-08-25 DIAGNOSIS — J45909 Unspecified asthma, uncomplicated: Secondary | ICD-10-CM | POA: Diagnosis not present

## 2022-08-25 DIAGNOSIS — F32A Depression, unspecified: Secondary | ICD-10-CM | POA: Diagnosis not present

## 2022-08-25 DIAGNOSIS — D509 Iron deficiency anemia, unspecified: Secondary | ICD-10-CM | POA: Diagnosis not present

## 2022-08-25 DIAGNOSIS — Z79899 Other long term (current) drug therapy: Secondary | ICD-10-CM | POA: Diagnosis not present

## 2022-08-25 DIAGNOSIS — F419 Anxiety disorder, unspecified: Secondary | ICD-10-CM | POA: Diagnosis not present

## 2022-08-25 LAB — PREGNANCY, URINE: Preg Test, Ur: NEGATIVE

## 2022-08-25 MED ORDER — SODIUM CHLORIDE 0.9 % IV SOLN
200.0000 mg | Freq: Once | INTRAVENOUS | Status: AC
  Administered 2022-08-25: 200 mg via INTRAVENOUS
  Filled 2022-08-25: qty 200

## 2022-08-25 MED ORDER — SODIUM CHLORIDE 0.9 % IV SOLN
Freq: Once | INTRAVENOUS | Status: AC
  Filled 2022-08-25: qty 250

## 2022-08-25 NOTE — Progress Notes (Signed)
Pt declined 30 minutes post obs. Pt verbalized understanding

## 2022-08-28 DIAGNOSIS — Z419 Encounter for procedure for purposes other than remedying health state, unspecified: Secondary | ICD-10-CM | POA: Diagnosis not present

## 2022-09-01 ENCOUNTER — Inpatient Hospital Stay: Payer: Medicaid Other | Attending: Oncology

## 2022-09-01 ENCOUNTER — Inpatient Hospital Stay: Payer: Medicaid Other

## 2022-09-01 VITALS — BP 108/59 | HR 67 | Temp 97.5°F | Resp 16

## 2022-09-01 DIAGNOSIS — D509 Iron deficiency anemia, unspecified: Secondary | ICD-10-CM | POA: Insufficient documentation

## 2022-09-01 MED ORDER — SODIUM CHLORIDE 0.9 % IV SOLN
Freq: Once | INTRAVENOUS | Status: AC
  Filled 2022-09-01: qty 250

## 2022-09-01 MED ORDER — SODIUM CHLORIDE 0.9 % IV SOLN
200.0000 mg | Freq: Once | INTRAVENOUS | Status: AC
  Administered 2022-09-01: 200 mg via INTRAVENOUS
  Filled 2022-09-01: qty 200

## 2022-09-01 NOTE — Patient Instructions (Signed)

## 2022-09-01 NOTE — Progress Notes (Signed)
Pt refused urine preg prior to venofer. Pt states she is on her menstrual cycle now. Dr. Cathie Hoops aware. Ok to proceed. Pt declined 30 minute post observation.

## 2022-09-08 ENCOUNTER — Inpatient Hospital Stay: Payer: Medicaid Other

## 2022-09-08 VITALS — BP 94/64 | HR 90 | Temp 97.3°F | Resp 18

## 2022-09-08 DIAGNOSIS — D509 Iron deficiency anemia, unspecified: Secondary | ICD-10-CM

## 2022-09-08 LAB — PREGNANCY, URINE: Preg Test, Ur: NEGATIVE

## 2022-09-08 MED ORDER — SODIUM CHLORIDE 0.9 % IV SOLN
Freq: Once | INTRAVENOUS | Status: AC
  Filled 2022-09-08: qty 250

## 2022-09-08 MED ORDER — SODIUM CHLORIDE 0.9 % IV SOLN
200.0000 mg | Freq: Once | INTRAVENOUS | Status: AC
  Administered 2022-09-08: 200 mg via INTRAVENOUS
  Filled 2022-09-08: qty 200

## 2022-09-08 NOTE — Patient Instructions (Signed)

## 2022-09-14 ENCOUNTER — Other Ambulatory Visit: Payer: Self-pay

## 2022-09-14 DIAGNOSIS — D509 Iron deficiency anemia, unspecified: Secondary | ICD-10-CM

## 2022-09-15 ENCOUNTER — Inpatient Hospital Stay: Payer: Medicaid Other

## 2022-09-15 VITALS — BP 94/64 | HR 72 | Temp 96.7°F | Resp 18

## 2022-09-15 DIAGNOSIS — D509 Iron deficiency anemia, unspecified: Secondary | ICD-10-CM | POA: Diagnosis not present

## 2022-09-15 LAB — PREGNANCY, URINE: Preg Test, Ur: NEGATIVE

## 2022-09-15 MED ORDER — SODIUM CHLORIDE 0.9 % IV SOLN
Freq: Once | INTRAVENOUS | Status: AC
  Filled 2022-09-15: qty 250

## 2022-09-15 MED ORDER — SODIUM CHLORIDE 0.9 % IV SOLN
200.0000 mg | Freq: Once | INTRAVENOUS | Status: AC
  Administered 2022-09-15: 200 mg via INTRAVENOUS
  Filled 2022-09-15: qty 200

## 2022-09-15 NOTE — Patient Instructions (Signed)

## 2022-09-22 DIAGNOSIS — H5213 Myopia, bilateral: Secondary | ICD-10-CM | POA: Diagnosis not present

## 2022-09-27 DIAGNOSIS — Z419 Encounter for procedure for purposes other than remedying health state, unspecified: Secondary | ICD-10-CM | POA: Diagnosis not present

## 2022-10-28 DIAGNOSIS — Z419 Encounter for procedure for purposes other than remedying health state, unspecified: Secondary | ICD-10-CM | POA: Diagnosis not present

## 2022-11-12 DIAGNOSIS — M2550 Pain in unspecified joint: Secondary | ICD-10-CM | POA: Diagnosis not present

## 2022-11-12 DIAGNOSIS — R21 Rash and other nonspecific skin eruption: Secondary | ICD-10-CM | POA: Diagnosis not present

## 2022-11-19 ENCOUNTER — Other Ambulatory Visit: Payer: Self-pay

## 2022-11-19 ENCOUNTER — Inpatient Hospital Stay: Payer: Medicaid Other | Attending: Oncology

## 2022-11-19 DIAGNOSIS — R300 Dysuria: Secondary | ICD-10-CM

## 2022-11-19 DIAGNOSIS — D509 Iron deficiency anemia, unspecified: Secondary | ICD-10-CM | POA: Diagnosis not present

## 2022-11-19 LAB — CBC WITH DIFFERENTIAL (CANCER CENTER ONLY)
Abs Immature Granulocytes: 0.03 10*3/uL (ref 0.00–0.07)
Basophils Absolute: 0 10*3/uL (ref 0.0–0.1)
Basophils Relative: 1 %
Eosinophils Absolute: 0.2 10*3/uL (ref 0.0–0.5)
Eosinophils Relative: 4 %
HCT: 39.3 % (ref 36.0–46.0)
Hemoglobin: 12.6 g/dL (ref 12.0–15.0)
Immature Granulocytes: 1 %
Lymphocytes Relative: 31 %
Lymphs Abs: 1.3 10*3/uL (ref 0.7–4.0)
MCH: 29.5 pg (ref 26.0–34.0)
MCHC: 32.1 g/dL (ref 30.0–36.0)
MCV: 92 fL (ref 80.0–100.0)
Monocytes Absolute: 0.4 10*3/uL (ref 0.1–1.0)
Monocytes Relative: 10 %
Neutro Abs: 2.2 10*3/uL (ref 1.7–7.7)
Neutrophils Relative %: 53 %
Platelet Count: 214 10*3/uL (ref 150–400)
RBC: 4.27 MIL/uL (ref 3.87–5.11)
RDW: 14.7 % (ref 11.5–15.5)
WBC Count: 4.1 10*3/uL (ref 4.0–10.5)
nRBC: 0 % (ref 0.0–0.2)

## 2022-11-19 LAB — IRON AND TIBC
Iron: 58 ug/dL (ref 28–170)
Saturation Ratios: 17 % (ref 10.4–31.8)
TIBC: 350 ug/dL (ref 250–450)
UIBC: 292 ug/dL

## 2022-11-19 LAB — URINALYSIS, COMPLETE (UACMP) WITH MICROSCOPIC
Bacteria, UA: NONE SEEN
Bilirubin Urine: NEGATIVE
Glucose, UA: NEGATIVE mg/dL
Ketones, ur: NEGATIVE mg/dL
Leukocytes,Ua: NEGATIVE
Nitrite: NEGATIVE
Protein, ur: NEGATIVE mg/dL
RBC / HPF: >50 RBC/hpf (ref 0–5)
Specific Gravity, Urine: 1.019 (ref 1.005–1.030)
WBC, UA: NONE SEEN WBC/hpf (ref 0–5)
pH: 6 (ref 5.0–8.0)

## 2022-11-19 LAB — RETIC PANEL
Immature Retic Fract: 5.1 % (ref 2.3–15.9)
RBC.: 4.23 MIL/uL (ref 3.87–5.11)
Retic Count, Absolute: 40.2 10*3/uL (ref 19.0–186.0)
Retic Ct Pct: 1 % (ref 0.4–3.1)
Reticulocyte Hemoglobin: 35.1 pg (ref >27.9)

## 2022-11-19 LAB — FERRITIN: Ferritin: 36 ng/mL (ref 11–307)

## 2022-11-19 LAB — PREGNANCY, URINE: Preg Test, Ur: NEGATIVE

## 2022-11-20 LAB — URINE CULTURE: Culture: NO GROWTH

## 2022-11-23 ENCOUNTER — Inpatient Hospital Stay (HOSPITAL_BASED_OUTPATIENT_CLINIC_OR_DEPARTMENT_OTHER): Payer: Medicaid Other | Admitting: Oncology

## 2022-11-23 ENCOUNTER — Encounter: Payer: Self-pay | Admitting: Oncology

## 2022-11-23 ENCOUNTER — Inpatient Hospital Stay: Payer: Medicaid Other

## 2022-11-23 VITALS — BP 100/64 | HR 86 | Temp 97.8°F | Resp 18 | Wt 152.9 lb

## 2022-11-23 DIAGNOSIS — D509 Iron deficiency anemia, unspecified: Secondary | ICD-10-CM

## 2022-11-23 NOTE — Progress Notes (Signed)
Hematology/Oncology Consult note [facility] Telephone:([phone] Fax:([phone]) [phone]   Patient Care Team: [name], MD as PCP - General (Infectious Diseases) [name], MD as Consulting Physician (Oncology)  CHIEF COMPLAINTS/REASON FOR VISIT:  Iron deficiency anemia  ASSESSMENT & PLAN:   Iron deficiency anemia Labs are reviewed and discussed with patient. Lab Results  Component Value Date   HGB 12.6 11/19/2022   TIBC 350 11/19/2022   IRONPCTSAT 17 11/19/2022   FERRITIN 36 11/19/2022   Hb and iron panel have both improved and normalized No Venofer today Recommend iron contained multivitamin supplementation as maintenance.     Orders Placed This Encounter  Procedures   CBC with Differential (Cancer Center Only)    Standing Status:   Future    Standing Expiration Date:   11/23/2023   Iron and TIBC    Standing Status:   Future    Standing Expiration Date:   11/23/2023   Ferritin    Standing Status:   Future    Standing Expiration Date:   11/23/2023   Follow-up in 6 months. All questions were answered. The patient knows to call the clinic with any problems, questions or concerns.  [name], MD, PhD [facility] Hematology Oncology 11/23/2022   HISTORY OF PRESENTING ILLNESS:   Sharon Salazar is a  32 y.o.  female with PMH listed below was seen in consultation at the request of  [name], MD  for evaluation of anemia  Patient has chronic anemia.  She reports previous history of receiving either blood transfusion or iron infusion back in 2015 after she cut her left arm accidentally. She takes oral iron supplementation intermittently. Denies any bloody or tarry stool.  She reports that her menstrual period has been light.  Usually lasts 4 to 5 days.  On the heaviest day, she only uses 1 or 2 pads during the day. She describes some epigastric "folding discomfort" after eating. Per patient, she feels extremely hungry after eating.   Her primary care provider Dr. Sampson Goon plans to check parasites.  She was informed that the test kit will arrive in the mail.  Other chronic medical problems include psoriasis with chronic arthralgia and myalgia.  Depression/anxiety, asthma.  She has been advised to take meloxicam as needed for inflammation and pain.  On Zoloft for mood disorder.  Trazodone for sleep.  Lost follow-up afterwards.  She presents to reestablish care on 08/18/22   INTERVAL HISTORY Sharon Salazar is a 32 y.o. female who has above history reviewed by me today presents for follow up visit for iron deficiency anemia. She tolerates IV venofer  Denies hematochezia, hematuria, hematemesis, epistaxis, black tarry stool or easy bruising.    Review of Systems  Constitutional:  Positive for fatigue. Negative for appetite change, chills and fever.  HENT:   Negative for hearing loss and voice change.   Eyes:  Negative for eye problems.  Respiratory:  Negative for chest tightness and cough.   Cardiovascular:  Negative for chest pain.  Gastrointestinal:  Negative for abdominal distention, abdominal pain and blood in stool.  Endocrine: Negative for hot flashes.  Genitourinary:  Negative for difficulty urinating and frequency.   Musculoskeletal:  Positive for arthralgias.  Skin:  Negative for itching and rash.  Neurological:  Negative for extremity weakness.  Hematological:  Negative for adenopathy.  Psychiatric/Behavioral:  Negative for confusion.     MEDICAL HISTORY:  Past Medical History:  Diagnosis Date   Anemia    Anxiety  Asthma     SURGICAL HISTORY: Past Surgical History:  Procedure Laterality Date   left arm surgery Left    tonsillectcomy      SOCIAL HISTORY: Social History   Socioeconomic History   Marital status: Single    Spouse name: Not on file   Number of children: Not on file   Years of education: Not on file   Highest education level: Not on file  Occupational History   Not on file   Tobacco Use   Smoking status: Never   Smokeless tobacco: Never  Vaping Use   Vaping status: Never Used  Substance and Sexual Activity   Alcohol use: Not Currently    Comment: occasional   Drug use: Yes    Types: Marijuana   Sexual activity: Yes  Other Topics Concern   Not on file  Social History Narrative   Not on file   Social Determinants of Health   Financial Resource Strain: Low Risk  (05/19/2022)   Received from [facility], [facility]   Overall Financial Resource Strain (CARDIA)    Difficulty of Paying Living Expenses: Not hard at all  Food Insecurity: No Food Insecurity (05/19/2022)   Received from [facility], [facility]   Hunger Vital Sign    Worried About Running Out of Food in the Last Year: Never true    Ran Out of Food in the Last Year: Never true  Transportation Needs: No Transportation Needs (05/19/2022)   Received from [facility], [facility]   [facility] - Transportation    In the past 12 months, has lack of transportation kept you from medical appointments or from getting medications?: No    Lack of Transportation (Non-Medical): No  Physical Activity: Not on file  Stress: Not on file  Social Connections: Not on file  Intimate Partner Violence: Not on file    FAMILY HISTORY: Family History  Problem Relation Age of Onset   Anemia Mother    Sarcoidosis Maternal Grandmother     ALLERGIES:  is allergic to shellfish allergy, cranberry, and other.  MEDICATIONS:  Current Outpatient Medications  Medication Sig Dispense Refill   albuterol (VENTOLIN HFA) 108 (90 Base) MCG/ACT inhaler Inhale 2 puffs into the lungs every 6 (six) hours as needed for wheezing or shortness of breath. 8 g 0   clotrimazole-betamethasone (LOTRISONE) cream Apply 1 Application topically 2 (two) times daily. 30 g 0   FLUoxetine (PROZAC) 20 MG capsule Take 20 mg by mouth daily.      ibuprofen (ADVIL) 600 MG tablet Take 1 tablet (600 mg total) by mouth every 8 (eight) hours as needed. With food 30 tablet 0   sertraline (ZOLOFT) 25 MG tablet Take 25 mg by mouth daily.     terbinafine (LAMISIL) 250 MG tablet Take 1 tablet (250 mg total) by mouth daily. 90 tablet 0   Vitamin D, Ergocalciferol, (DRISDOL) 1.25 MG (50000 UNIT) CAPS capsule Take 50,000 Units by mouth every 7 (seven) days.     No current facility-administered medications for this visit.     PHYSICAL EXAMINATION: ECOG PERFORMANCE STATUS: 1 - Symptomatic but completely ambulatory There were no vitals filed for this visit.  There were no vitals filed for this visit.   Physical Exam Constitutional:      General: She is not in acute distress. HENT:     Head: Normocephalic and atraumatic.  Eyes:     General: No scleral  icterus. Cardiovascular:     Rate and Rhythm: Normal rate and regular rhythm.     Heart sounds: Normal heart sounds.  Pulmonary:     Effort: Pulmonary effort is normal. No respiratory distress.     Breath sounds: No wheezing.  Abdominal:     General: Bowel sounds are normal. There is no distension.     Palpations: Abdomen is soft.  Musculoskeletal:        General: No deformity. Normal range of motion.     Cervical back: Normal range of motion and neck supple.  Skin:    General: Skin is warm and dry.     Findings: No erythema or rash.  Neurological:     Mental Status: She is alert and oriented to person, place, and time. Mental status is at baseline.     Cranial Nerves: No cranial nerve deficit.     Coordination: Coordination normal.  Psychiatric:        Mood and Affect: Mood normal.     LABORATORY DATA:  I have reviewed the data as listed Lab Results  Component Value Date   WBC 4.1 11/19/2022   HGB 12.6 11/19/2022   HCT 39.3 11/19/2022   MCV 92.0 11/19/2022   PLT 214 11/19/2022   Recent Labs    04/09/22 1051 06/16/22 1050 08/05/22 1247  NA  --  137 134*  K  --   3.9 3.7  CL  --  105 104  CO2  --  24 24  GLUCOSE  --  92 81  BUN  --  7 7  CREATININE  --  0.62 0.79  CALCIUM  --  9.1 8.7*  GFRNONAA  --  >60 >60  PROT 6.9 7.2 7.1  ALBUMIN 4.2 3.8 3.9  AST 19 23 20   ALT 13 14 10   ALKPHOS 69 53 47  BILITOT 0.2 0.5 0.6  BILIDIR <0.10  --   --    Iron/TIBC/Ferritin/ %Sat    Component Value Date/Time   IRON 58 11/19/2022 0816   TIBC 350 11/19/2022 0816   FERRITIN 36 11/19/2022 0816   IRONPCTSAT 17 11/19/2022 0816      RADIOGRAPHIC STUDIES: I have personally reviewed the radiological images as listed and agreed with the findings in the report. No results found.

## 2022-11-23 NOTE — Assessment & Plan Note (Addendum)
Labs are reviewed and discussed with patient. Lab Results  Component Value Date   HGB 12.6 11/19/2022   TIBC 350 11/19/2022   IRONPCTSAT 17 11/19/2022   FERRITIN 36 11/19/2022   Hb and iron panel have both improved and normalized No Venofer today Recommend iron contained multivitamin supplementation as maintenance.   UA showed large hemoglobin due to patient had menstrual period when urine specimen was collected.

## 2022-11-23 NOTE — Progress Notes (Signed)
No Venofer today per MD 

## 2022-11-28 DIAGNOSIS — Z419 Encounter for procedure for purposes other than remedying health state, unspecified: Secondary | ICD-10-CM | POA: Diagnosis not present

## 2022-12-28 DIAGNOSIS — Z419 Encounter for procedure for purposes other than remedying health state, unspecified: Secondary | ICD-10-CM | POA: Diagnosis not present

## 2023-01-28 DIAGNOSIS — Z419 Encounter for procedure for purposes other than remedying health state, unspecified: Secondary | ICD-10-CM | POA: Diagnosis not present

## 2023-02-12 ENCOUNTER — Emergency Department: Payer: Medicaid Other

## 2023-02-12 ENCOUNTER — Emergency Department
Admission: EM | Admit: 2023-02-12 | Discharge: 2023-02-12 | Disposition: A | Discharge status: Home / Self Care | Payer: Medicaid Other | Attending: Student in an Organized Health Care Education/Training Program | Admitting: Student in an Organized Health Care Education/Training Program

## 2023-02-12 ENCOUNTER — Other Ambulatory Visit: Payer: Self-pay

## 2023-02-12 ENCOUNTER — Encounter: Payer: Self-pay | Admitting: Oncology

## 2023-02-12 DIAGNOSIS — S199XXA Unspecified injury of neck, initial encounter: Secondary | ICD-10-CM | POA: Diagnosis present

## 2023-02-12 DIAGNOSIS — M542 Cervicalgia: Secondary | ICD-10-CM | POA: Diagnosis not present

## 2023-02-12 DIAGNOSIS — M25512 Pain in left shoulder: Secondary | ICD-10-CM | POA: Insufficient documentation

## 2023-02-12 DIAGNOSIS — S39012A Strain of muscle, fascia and tendon of lower back, initial encounter: Secondary | ICD-10-CM | POA: Insufficient documentation

## 2023-02-12 DIAGNOSIS — S161XXA Strain of muscle, fascia and tendon at neck level, initial encounter: Secondary | ICD-10-CM | POA: Insufficient documentation

## 2023-02-12 DIAGNOSIS — Y92410 Unspecified street and highway as the place of occurrence of the external cause: Secondary | ICD-10-CM | POA: Diagnosis not present

## 2023-02-12 DIAGNOSIS — M545 Low back pain, unspecified: Secondary | ICD-10-CM | POA: Diagnosis not present

## 2023-02-12 DIAGNOSIS — Z041 Encounter for examination and observation following transport accident: Secondary | ICD-10-CM | POA: Diagnosis not present

## 2023-02-12 LAB — POC URINE PREG, ED: Preg Test, Ur: NEGATIVE

## 2023-02-12 MED ORDER — METHOCARBAMOL 500 MG PO TABS: 500.0000 mg | ORAL_TABLET | Freq: Four times a day (QID) | ORAL | 0 refills | Status: DC

## 2023-02-12 MED ORDER — KETOROLAC TROMETHAMINE 30 MG/ML IJ SOLN
30.0000 mg | Freq: Once | INTRAMUSCULAR | Status: AC
  Administered 2023-02-12: 30 mg via INTRAMUSCULAR
  Filled 2023-02-12: qty 1

## 2023-02-12 MED ORDER — MELOXICAM 15 MG PO TABS: 15.0000 mg | ORAL_TABLET | Freq: Every day | ORAL | 0 refills | Status: DC

## 2023-02-12 MED ORDER — METHOCARBAMOL 500 MG PO TABS
1000.0000 mg | ORAL_TABLET | Freq: Once | ORAL | Status: AC
  Administered 2023-02-12: 1000 mg via ORAL
  Filled 2023-02-12: qty 2

## 2023-02-12 NOTE — ED Notes (Signed)
Patient ambulated with a steady gait to hallway bathroom. 
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
## 2023-02-17 DIAGNOSIS — G8929 Other chronic pain: Secondary | ICD-10-CM | POA: Diagnosis not present

## 2023-02-17 DIAGNOSIS — M545 Low back pain, unspecified: Secondary | ICD-10-CM | POA: Diagnosis not present

## 2023-02-17 DIAGNOSIS — L409 Psoriasis, unspecified: Secondary | ICD-10-CM | POA: Diagnosis not present

## 2023-02-17 DIAGNOSIS — R1013 Epigastric pain: Secondary | ICD-10-CM | POA: Diagnosis not present

## 2023-02-17 DIAGNOSIS — K760 Fatty (change of) liver, not elsewhere classified: Secondary | ICD-10-CM | POA: Diagnosis not present

## 2023-02-17 DIAGNOSIS — D509 Iron deficiency anemia, unspecified: Secondary | ICD-10-CM | POA: Diagnosis not present

## 2023-02-27 DIAGNOSIS — Z419 Encounter for procedure for purposes other than remedying health state, unspecified: Secondary | ICD-10-CM | POA: Diagnosis not present

## 2023-03-17 DIAGNOSIS — Z87828 Personal history of other (healed) physical injury and trauma: Secondary | ICD-10-CM | POA: Diagnosis not present

## 2023-03-17 DIAGNOSIS — M5431 Sciatica, right side: Secondary | ICD-10-CM | POA: Diagnosis not present

## 2023-03-17 DIAGNOSIS — M2550 Pain in unspecified joint: Secondary | ICD-10-CM | POA: Diagnosis not present

## 2023-03-17 DIAGNOSIS — M5432 Sciatica, left side: Secondary | ICD-10-CM | POA: Diagnosis not present

## 2023-03-17 DIAGNOSIS — R21 Rash and other nonspecific skin eruption: Secondary | ICD-10-CM | POA: Diagnosis not present

## 2023-03-30 DIAGNOSIS — Z419 Encounter for procedure for purposes other than remedying health state, unspecified: Secondary | ICD-10-CM | POA: Diagnosis not present

## 2023-04-11 DIAGNOSIS — G8929 Other chronic pain: Secondary | ICD-10-CM | POA: Diagnosis not present

## 2023-04-11 DIAGNOSIS — L409 Psoriasis, unspecified: Secondary | ICD-10-CM | POA: Diagnosis not present

## 2023-04-11 DIAGNOSIS — D649 Anemia, unspecified: Secondary | ICD-10-CM | POA: Diagnosis not present

## 2023-04-11 DIAGNOSIS — D509 Iron deficiency anemia, unspecified: Secondary | ICD-10-CM | POA: Diagnosis not present

## 2023-04-11 DIAGNOSIS — E559 Vitamin D deficiency, unspecified: Secondary | ICD-10-CM | POA: Diagnosis not present

## 2023-04-11 DIAGNOSIS — M545 Low back pain, unspecified: Secondary | ICD-10-CM | POA: Diagnosis not present

## 2023-04-30 DIAGNOSIS — Z419 Encounter for procedure for purposes other than remedying health state, unspecified: Secondary | ICD-10-CM | POA: Diagnosis not present

## 2023-05-10 DIAGNOSIS — B351 Tinea unguium: Secondary | ICD-10-CM | POA: Diagnosis not present

## 2023-05-10 DIAGNOSIS — L409 Psoriasis, unspecified: Secondary | ICD-10-CM | POA: Diagnosis not present

## 2023-05-10 DIAGNOSIS — D509 Iron deficiency anemia, unspecified: Secondary | ICD-10-CM | POA: Diagnosis not present

## 2023-05-10 DIAGNOSIS — D649 Anemia, unspecified: Secondary | ICD-10-CM | POA: Diagnosis not present

## 2023-05-10 DIAGNOSIS — E559 Vitamin D deficiency, unspecified: Secondary | ICD-10-CM | POA: Diagnosis not present

## 2023-05-23 ENCOUNTER — Inpatient Hospital Stay: Payer: No Typology Code available for payment source | Attending: Oncology

## 2023-05-23 DIAGNOSIS — D509 Iron deficiency anemia, unspecified: Secondary | ICD-10-CM | POA: Diagnosis not present

## 2023-05-23 DIAGNOSIS — Z79899 Other long term (current) drug therapy: Secondary | ICD-10-CM | POA: Insufficient documentation

## 2023-05-23 LAB — IRON AND TIBC
Iron: 59 ug/dL (ref 28–170)
Saturation Ratios: 14 % (ref 10.4–31.8)
TIBC: 412 ug/dL (ref 250–450)
UIBC: 353 ug/dL

## 2023-05-23 LAB — CBC WITH DIFFERENTIAL (CANCER CENTER ONLY)
Abs Immature Granulocytes: 0.01 10*3/uL (ref 0.00–0.07)
Basophils Absolute: 0 10*3/uL (ref 0.0–0.1)
Basophils Relative: 1 %
Eosinophils Absolute: 0.1 10*3/uL (ref 0.0–0.5)
Eosinophils Relative: 3 %
HCT: 36.5 % (ref 36.0–46.0)
Hemoglobin: 12 g/dL (ref 12.0–15.0)
Immature Granulocytes: 0 %
Lymphocytes Relative: 23 %
Lymphs Abs: 1 10*3/uL (ref 0.7–4.0)
MCH: 30.2 pg (ref 26.0–34.0)
MCHC: 32.9 g/dL (ref 30.0–36.0)
MCV: 91.7 fL (ref 80.0–100.0)
Monocytes Absolute: 0.4 10*3/uL (ref 0.1–1.0)
Monocytes Relative: 8 %
Neutro Abs: 2.9 10*3/uL (ref 1.7–7.7)
Neutrophils Relative %: 65 %
Platelet Count: 230 10*3/uL (ref 150–400)
RBC: 3.98 MIL/uL (ref 3.87–5.11)
RDW: 13.3 % (ref 11.5–15.5)
WBC Count: 4.5 10*3/uL (ref 4.0–10.5)
nRBC: 0 % (ref 0.0–0.2)

## 2023-05-23 LAB — FERRITIN: Ferritin: 5 ng/mL — ABNORMAL LOW (ref 11–307)

## 2023-05-26 ENCOUNTER — Inpatient Hospital Stay (HOSPITAL_BASED_OUTPATIENT_CLINIC_OR_DEPARTMENT_OTHER): Payer: No Typology Code available for payment source | Admitting: Oncology

## 2023-05-26 ENCOUNTER — Inpatient Hospital Stay: Payer: No Typology Code available for payment source

## 2023-05-26 ENCOUNTER — Encounter: Payer: Self-pay | Admitting: Oncology

## 2023-05-26 VITALS — BP 119/73 | HR 65 | Temp 97.8°F | Resp 19

## 2023-05-26 VITALS — BP 105/68 | HR 62 | Temp 96.6°F | Resp 18 | Wt 161.2 lb

## 2023-05-26 DIAGNOSIS — D509 Iron deficiency anemia, unspecified: Secondary | ICD-10-CM

## 2023-05-26 DIAGNOSIS — Z79899 Other long term (current) drug therapy: Secondary | ICD-10-CM | POA: Diagnosis not present

## 2023-05-26 LAB — PREGNANCY, URINE: Preg Test, Ur: NEGATIVE

## 2023-05-26 MED ORDER — SODIUM CHLORIDE 0.9% FLUSH
10.0000 mL | Freq: Once | INTRAVENOUS | Status: AC | PRN
  Administered 2023-05-26: 10 mL
  Filled 2023-05-26: qty 10

## 2023-05-26 MED ORDER — IRON SUCROSE 20 MG/ML IV SOLN
200.0000 mg | Freq: Once | INTRAVENOUS | Status: AC
  Administered 2023-05-26: 200 mg via INTRAVENOUS

## 2023-05-26 NOTE — Patient Instructions (Signed)

## 2023-05-26 NOTE — Progress Notes (Signed)
 Hematology/Oncology Consult note [facility] Telephone:([phone] Fax:(336) [phone]   Patient Care Team: [name], MD as PCP - General (Infectious Diseases) [name], MD as Consulting Physician (Oncology)  CHIEF COMPLAINTS/REASON FOR VISIT:  Iron deficiency anemia  ASSESSMENT & PLAN:   Iron deficiency anemia Labs are reviewed and discussed with patient. Lab Results  Component Value Date   HGB 12.0 05/23/2023   TIBC 412 05/23/2023   IRONPCTSAT 14 05/23/2023   FERRITIN 5 (L) 05/23/2023   Hb is normal, Ferritin has decreased again.  Venofer weekly x 3  Left hand/thumb pain- recommend patient to discuss with primary provider for further evaluation.   Orders Placed This Encounter  Procedures   Pregnancy, urine    Standing Status:   Standing    Number of Occurrences:   5    Expiration Date:   05/25/2024   CBC with Differential (Cancer Center Only)    Standing Status:   Future    Expected Date:   11/23/2023    Expiration Date:   05/25/2024   Iron and TIBC    Standing Status:   Future    Expected Date:   11/23/2023    Expiration Date:   05/25/2024   Ferritin    Standing Status:   Future    Expected Date:   11/23/2023    Expiration Date:   05/25/2024   Pregnancy, urine    Standing Status:   Future    Expected Date:   11/23/2023    Expiration Date:   05/25/2024   Follow-up in 6 months. All questions were answered. The patient knows to call the clinic with any problems, questions or concerns.  [name], MD, PhD [facility] Hematology Oncology 05/26/2023   HISTORY OF PRESENTING ILLNESS:   Sharon Salazar is a  33 y.o.  female with PMH listed below was seen in consultation at the request of  [name], MD  for evaluation of anemia  Patient has chronic anemia.  She reports previous history of receiving either blood transfusion or iron infusion back in 2015 after she cut her left arm accidentally. She takes oral iron supplementation  intermittently. Denies any bloody or tarry stool.  She reports that her menstrual period has been light.  Usually lasts 4 to 5 days.  On the heaviest day, she only uses 1 or 2 pads during the day. She describes some epigastric "folding discomfort" after eating. Per patient, she feels extremely hungry after eating.  Her primary care provider Dr. Sampson Goon plans to check parasites.  She was informed that the test kit will arrive in the mail.  Other chronic medical problems include psoriasis with chronic arthralgia and myalgia.  Depression/anxiety, asthma.  She has been advised to take meloxicam as needed for inflammation and pain.  On Zoloft for mood disorder.  Trazodone for sleep.  Lost follow-up afterwards.  She presents to reestablish care on 08/18/22   INTERVAL HISTORY Sharon Salazar is a 33 y.o. female who has above history reviewed by me today presents for follow up visit for iron deficiency anemia. She tolerates IV venofer  Today she reports feeling tired.  She has noticed left wrist and hand pain. She has history of left upper extremity artery repair.  Denies hematochezia, hematuria, hematemesis, epistaxis, black tarry stool or easy bruising.    Review of Systems  Constitutional:  Positive for fatigue. Negative for appetite change, chills and fever.  HENT:   Negative for hearing loss and voice change.  Eyes:  Negative for eye problems.  Respiratory:  Negative for chest tightness and cough.   Cardiovascular:  Negative for chest pain.  Gastrointestinal:  Negative for abdominal distention, abdominal pain and blood in stool.  Endocrine: Negative for hot flashes.  Genitourinary:  Negative for difficulty urinating and frequency.   Musculoskeletal:  Positive for arthralgias.  Skin:  Negative for itching and rash.  Neurological:  Negative for extremity weakness.  Hematological:  Negative for adenopathy.  Psychiatric/Behavioral:  Negative for confusion.     MEDICAL HISTORY:  Past  Medical History:  Diagnosis Date   Anemia    Anxiety    Asthma     SURGICAL HISTORY: Past Surgical History:  Procedure Laterality Date   left arm surgery Left    tonsillectcomy      SOCIAL HISTORY: Social History   Socioeconomic History   Marital status: Single    Spouse name: Not on file   Number of children: Not on file   Years of education: Not on file   Highest education level: Not on file  Occupational History   Not on file  Tobacco Use   Smoking status: Never   Smokeless tobacco: Never  Vaping Use   Vaping status: Never Used  Substance and Sexual Activity   Alcohol use: Not Currently    Comment: occasional   Drug use: Yes    Types: Marijuana   Sexual activity: Yes  Other Topics Concern   Not on file  Social History Narrative   Not on file   Social Drivers of Health   Financial Resource Strain: Low Risk  (04/11/2023)   Received from [facility]   Overall Financial Resource Strain (CARDIA)    Difficulty of Paying Living Expenses: Not hard at all  Food Insecurity: No Food Insecurity (04/11/2023)   [facility]   Hunger Vital Sign    Worried About Running Out of Food in the Last Year: Never true    Ran Out of Food in the Last Year: Never true  Transportation Needs: No Transportation Needs (04/11/2023)   Received from [facility] - Transportation    In the past 12 months, has lack of transportation kept you from medical appointments or from getting medications?: No    Lack of Transportation (Non-Medical): No  Physical Activity: Not on file  Stress: Not on file  Social Connections: Not on file  Intimate Partner Violence: Not on file    FAMILY HISTORY: Family History  Problem Relation Age of Onset   Anemia Mother    Sarcoidosis Maternal Grandmother     ALLERGIES:  is allergic to shellfish allergy, banana, cranberry, and other.  MEDICATIONS:  Current Outpatient Medications   Medication Sig Dispense Refill   meloxicam (MOBIC) 15 MG tablet Take 1 tablet (15 mg total) by mouth daily. 30 tablet 0   methocarbamol (ROBAXIN) 500 MG tablet Take 1 tablet (500 mg total) by mouth 4 (four) times daily. 16 tablet 0   sertraline (ZOLOFT) 25 MG tablet Take 25 mg by mouth daily.     albuterol (VENTOLIN HFA) 108 (90 Base) MCG/ACT inhaler Inhale 2 puffs into the lungs every 6 (six) hours as needed for wheezing or shortness of breath. (Patient not taking: Reported on 05/26/2023) 8 g 0   clotrimazole-betamethasone (LOTRISONE) cream Apply 1 Application topically 2 (two) times daily. (Patient not taking: Reported on 05/26/2023) 30 g 0   FLUoxetine (PROZAC) 20 MG capsule Take 20  mg by mouth daily. (Patient not taking: Reported on 11/23/2022)     ibuprofen (ADVIL) 600 MG tablet Take 1 tablet (600 mg total) by mouth every 8 (eight) hours as needed. With food (Patient not taking: Reported on 05/26/2023) 30 tablet 0   terbinafine (LAMISIL) 250 MG tablet Take 1 tablet (250 mg total) by mouth daily. (Patient not taking: Reported on 05/26/2023) 90 tablet 0   Vitamin D, Ergocalciferol, (DRISDOL) 1.25 MG (50000 UNIT) CAPS capsule Take 50,000 Units by mouth every 7 (seven) days. (Patient not taking: Reported on 05/26/2023)     No current facility-administered medications for this visit.     PHYSICAL EXAMINATION: ECOG PERFORMANCE STATUS: 1 - Symptomatic but completely ambulatory Vitals:   05/26/23 1350  BP: 105/68  Pulse: 62  Resp: 18  Temp: (!) 96.6 F (35.9 C)  SpO2: 100%    Filed Weights   05/26/23 1350  Weight: 161 lb 3.2 oz (73.1 kg)     Physical Exam Constitutional:      General: She is not in acute distress. HENT:     Head: Normocephalic and atraumatic.  Eyes:     General: No scleral icterus. Cardiovascular:     Rate and Rhythm: Normal rate and regular rhythm.     Heart sounds: Normal heart sounds.  Pulmonary:     Effort: Pulmonary effort is normal. No respiratory distress.      Breath sounds: No wheezing.  Abdominal:     General: Bowel sounds are normal. There is no distension.     Palpations: Abdomen is soft.  Musculoskeletal:        General: No deformity. Normal range of motion.     Cervical back: Normal range of motion and neck supple.     Comments: The pulse in the left radial artery is strong, and capillary refill in the left hand is normal.  Skin:    General: Skin is dry.     Findings: No rash.  Neurological:     Mental Status: She is alert and oriented to person, place, and time. Mental status is at baseline.  Psychiatric:        Mood and Affect: Mood normal.     LABORATORY DATA:  I have reviewed the data as listed Lab Results  Component Value Date   WBC 4.5 05/23/2023   HGB 12.0 05/23/2023   HCT 36.5 05/23/2023   MCV 91.7 05/23/2023   PLT 230 05/23/2023   Recent Labs    06/16/22 1050 08/05/22 1247  NA 137 134*  K 3.9 3.7  CL 105 104  CO2 24 24  GLUCOSE 92 81  BUN 7 7  CREATININE 0.62 0.79  CALCIUM 9.1 8.7*  GFRNONAA >60 >60  PROT 7.2 7.1  ALBUMIN 3.8 3.9  AST 23 20  ALT 14 10  ALKPHOS 53 47  BILITOT 0.5 0.6   Iron/TIBC/Ferritin/ %Sat    Component Value Date/Time   IRON 59 05/23/2023 0847   TIBC 412 05/23/2023 0847   FERRITIN 5 (L) 05/23/2023 0847   IRONPCTSAT 14 05/23/2023 0847      RADIOGRAPHIC STUDIES: I have personally reviewed the radiological images as listed and agreed with the findings in the report. No results found.

## 2023-05-26 NOTE — Assessment & Plan Note (Addendum)
 Labs are reviewed and discussed with patient. Lab Results  Component Value Date   HGB 12.0 05/23/2023   TIBC 412 05/23/2023   IRONPCTSAT 14 05/23/2023   FERRITIN 5 (L) 05/23/2023   Hb is normal, Ferritin has decreased again.  Venofer weekly x 3

## 2023-05-28 DIAGNOSIS — Z419 Encounter for procedure for purposes other than remedying health state, unspecified: Secondary | ICD-10-CM | POA: Diagnosis not present

## 2023-06-02 ENCOUNTER — Inpatient Hospital Stay: Payer: Medicaid Other | Attending: Oncology

## 2023-06-02 ENCOUNTER — Inpatient Hospital Stay: Payer: Medicaid Other

## 2023-06-02 VITALS — BP 110/72 | HR 66 | Temp 97.5°F | Resp 19

## 2023-06-02 DIAGNOSIS — D509 Iron deficiency anemia, unspecified: Secondary | ICD-10-CM | POA: Insufficient documentation

## 2023-06-02 LAB — PREGNANCY, URINE: Preg Test, Ur: NEGATIVE

## 2023-06-02 MED ORDER — SODIUM CHLORIDE 0.9% FLUSH
10.0000 mL | Freq: Once | INTRAVENOUS | Status: AC | PRN
  Administered 2023-06-02: 10 mL
  Filled 2023-06-02: qty 10

## 2023-06-02 MED ORDER — IRON SUCROSE 20 MG/ML IV SOLN
200.0000 mg | Freq: Once | INTRAVENOUS | Status: AC
  Administered 2023-06-02: 200 mg via INTRAVENOUS

## 2023-06-09 ENCOUNTER — Inpatient Hospital Stay: Payer: Medicaid Other

## 2023-06-10 DIAGNOSIS — D649 Anemia, unspecified: Secondary | ICD-10-CM | POA: Diagnosis not present

## 2023-06-10 DIAGNOSIS — M545 Low back pain, unspecified: Secondary | ICD-10-CM | POA: Diagnosis not present

## 2023-06-10 DIAGNOSIS — F32A Depression, unspecified: Secondary | ICD-10-CM | POA: Diagnosis not present

## 2023-06-10 DIAGNOSIS — D509 Iron deficiency anemia, unspecified: Secondary | ICD-10-CM | POA: Diagnosis not present

## 2023-06-10 DIAGNOSIS — F411 Generalized anxiety disorder: Secondary | ICD-10-CM | POA: Diagnosis not present

## 2023-06-10 DIAGNOSIS — G8929 Other chronic pain: Secondary | ICD-10-CM | POA: Diagnosis not present

## 2023-06-16 ENCOUNTER — Encounter: Payer: Self-pay | Admitting: Oncology

## 2023-06-16 DIAGNOSIS — M5432 Sciatica, left side: Secondary | ICD-10-CM | POA: Diagnosis not present

## 2023-06-16 DIAGNOSIS — Z87828 Personal history of other (healed) physical injury and trauma: Secondary | ICD-10-CM | POA: Diagnosis not present

## 2023-06-16 DIAGNOSIS — M5431 Sciatica, right side: Secondary | ICD-10-CM | POA: Diagnosis not present

## 2023-06-16 DIAGNOSIS — M2550 Pain in unspecified joint: Secondary | ICD-10-CM | POA: Diagnosis not present

## 2023-06-16 DIAGNOSIS — R21 Rash and other nonspecific skin eruption: Secondary | ICD-10-CM | POA: Diagnosis not present

## 2023-06-24 DIAGNOSIS — D649 Anemia, unspecified: Secondary | ICD-10-CM | POA: Diagnosis not present

## 2023-06-28 ENCOUNTER — Ambulatory Visit (INDEPENDENT_AMBULATORY_CARE_PROVIDER_SITE_OTHER): Admitting: Psychiatry

## 2023-06-28 DIAGNOSIS — F411 Generalized anxiety disorder: Secondary | ICD-10-CM

## 2023-06-28 NOTE — Progress Notes (Unsigned)
 At the start of the appointment, pt appeared to have another person in the room, attempting to complete the psychiatric interview.  Pt was working and providing hair care to another individual.  Pt was educated on the importance of privacy and that we would be unable to to complete the appointment at this time and would need to reschedule.  She understood and agreed to reschedule.

## 2023-06-29 DIAGNOSIS — M1611 Unilateral primary osteoarthritis, right hip: Secondary | ICD-10-CM | POA: Diagnosis not present

## 2023-06-29 DIAGNOSIS — M5416 Radiculopathy, lumbar region: Secondary | ICD-10-CM | POA: Diagnosis not present

## 2023-07-05 ENCOUNTER — Encounter: Payer: Self-pay | Admitting: Psychiatry

## 2023-07-05 ENCOUNTER — Ambulatory Visit (INDEPENDENT_AMBULATORY_CARE_PROVIDER_SITE_OTHER): Admitting: Psychiatry

## 2023-07-05 ENCOUNTER — Other Ambulatory Visit: Payer: Self-pay

## 2023-07-05 VITALS — BP 101/68 | HR 63 | Temp 97.5°F | Ht 67.0 in | Wt 157.6 lb

## 2023-07-05 DIAGNOSIS — F331 Major depressive disorder, recurrent, moderate: Secondary | ICD-10-CM | POA: Diagnosis not present

## 2023-07-05 DIAGNOSIS — F1290 Cannabis use, unspecified, uncomplicated: Secondary | ICD-10-CM | POA: Diagnosis not present

## 2023-07-05 DIAGNOSIS — F99 Mental disorder, not otherwise specified: Secondary | ICD-10-CM

## 2023-07-05 DIAGNOSIS — F411 Generalized anxiety disorder: Secondary | ICD-10-CM | POA: Diagnosis not present

## 2023-07-05 DIAGNOSIS — F5105 Insomnia due to other mental disorder: Secondary | ICD-10-CM

## 2023-07-05 DIAGNOSIS — L245 Irritant contact dermatitis due to other chemical products: Secondary | ICD-10-CM | POA: Diagnosis not present

## 2023-07-05 DIAGNOSIS — L2084 Intrinsic (allergic) eczema: Secondary | ICD-10-CM | POA: Diagnosis not present

## 2023-07-05 MED ORDER — TRAZODONE HCL 50 MG PO TABS: 50.0000 mg | ORAL_TABLET | Freq: Every day | ORAL | 0 refills | Status: DC

## 2023-07-05 MED ORDER — SERTRALINE HCL 25 MG PO TABS: 25.0000 mg | ORAL_TABLET | Freq: Every day | ORAL | 3 refills | Status: DC
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
## 2023-07-07 DIAGNOSIS — M5416 Radiculopathy, lumbar region: Secondary | ICD-10-CM | POA: Diagnosis not present

## 2023-07-09 DIAGNOSIS — Z419 Encounter for procedure for purposes other than remedying health state, unspecified: Secondary | ICD-10-CM | POA: Diagnosis not present

## 2023-07-18 DIAGNOSIS — M5416 Radiculopathy, lumbar region: Secondary | ICD-10-CM | POA: Diagnosis not present

## 2023-07-20 DIAGNOSIS — M5416 Radiculopathy, lumbar region: Secondary | ICD-10-CM | POA: Diagnosis not present

## 2023-07-25 DIAGNOSIS — M5416 Radiculopathy, lumbar region: Secondary | ICD-10-CM | POA: Diagnosis not present

## 2023-07-28 DIAGNOSIS — M5416 Radiculopathy, lumbar region: Secondary | ICD-10-CM | POA: Diagnosis not present

## 2023-08-01 ENCOUNTER — Telehealth: Admitting: Psychiatry

## 2023-08-01 DIAGNOSIS — F331 Major depressive disorder, recurrent, moderate: Secondary | ICD-10-CM

## 2023-08-01 DIAGNOSIS — M6281 Muscle weakness (generalized): Secondary | ICD-10-CM | POA: Diagnosis not present

## 2023-08-01 DIAGNOSIS — F5105 Insomnia due to other mental disorder: Secondary | ICD-10-CM

## 2023-08-01 DIAGNOSIS — F411 Generalized anxiety disorder: Secondary | ICD-10-CM | POA: Diagnosis not present

## 2023-08-01 DIAGNOSIS — M5416 Radiculopathy, lumbar region: Secondary | ICD-10-CM | POA: Diagnosis not present

## 2023-08-01 DIAGNOSIS — Z5941 Food insecurity: Secondary | ICD-10-CM | POA: Diagnosis not present

## 2023-08-01 MED ORDER — SERTRALINE HCL 50 MG PO TABS: 50.0000 mg | ORAL_TABLET | Freq: Every day | ORAL | 2 refills | Status: DC

## 2023-08-01 NOTE — Progress Notes (Addendum)
 [facility] MD/PA/NP OP Progress Note  08/01/2023 9:38 AM Sharon Salazar  MRN:  [id number]  Chief Complaint: "My husband and I are having problems"  Virtual Visit via Video Note  I connected with Sharon Salazar on 08/01/23 at  9:30 AM EDT by a video enabled telemedicine application and verified that I am speaking with the correct person using two identifiers.  Location: Patient: [address]  [name] [address]  Provider: [facility]   I discussed the limitations of evaluation and management by telemedicine and the availability of in person appointments. The patient expressed understanding and agreed to proceed.    I discussed the assessment and treatment plan with the patient. The patient was provided an opportunity to ask questions and all were answered. The patient agreed with the plan and demonstrated an understanding of the instructions.   The patient was advised to call back or seek an in-person evaluation if the symptoms worsen or if the condition fails to improve as anticipated.  I provided 30 minutes of non-face-to-face time during this encounter.   Sharon Labor, NP    HPI: 33 year old female presenting to [facility] for follow-up.  Patient reports that she continues to have relationship problems with her husband in which she is not feeling appreciated and is using marijuana to cope but had a reduced amount at the request of her provider asking her to cut back.  Patient reports that she has not picked up her trazodone  prescription in which she reports that she will attempt to do it today as she knows that her sleep has been a problem due to all of the situational stressors.  Patient endorses that she continues to have depression with anhedonia, hopelessness, feelings of worthlessness, anxiety, and irritability.  Patient reports she has been trying to distract herself with work but reports that every time she comes back home and becomes a problem because her husband always  goes out and drinks while she is at home by herself.  She states that she is unable to communicate with them and reports that every time she brings up that she would like to work on things that has been ignores her.  Patient was recommended for therapy stating that she may have to do one-to-one therapy with a CBT therapist to help navigate this relationship in which she stated she will consider it.  Patient was recommended go to [facility], or [employer] [email or web address] to search for a therapist with the Medicaid program.  Based on this assessment interview is recommended for the patient to be diagnosed generalized anxiety disorder, MDD, insomnia.  It is recommended for the patient to take 50 mg of sertraline  once a day.  Patient already also recommended to try to take trazodone  at night 50 mg once a night for sleep and if sleep continues to be a problem.  Patient denies SI, HI, AVH.  Patient verbalized agreement with treatment plan.  Patient will follow up in 1 month    ICD-10-CM   1. Generalized anxiety disorder  F41.1     2. MDD (major depressive disorder), recurrent episode, moderate (HCC)  F33.1 sertraline  (ZOLOFT ) 50 MG tablet    3. Insomnia due to other mental disorder  F51.05    F99       Past Psychiatric History:  Previous Psych Hospitalizations: 34 years old, [facility], diagnosed with bipolar disorder according to patient based on her behavior with her mother at the time.  No hospitalizations after 9  being 19.   Outpatient treatment: The patient being treated at her PCP by Dr. Harwood Lingo.   Medications Current: Sertraline  25 mg once daily from Dr. Harwood Lingo.   Medication Trials: Fluoxetine discontinued in 2023 due to side effects.   Suicide & Violence: Denies SI, HI.  Denies any previous violence behaviors.   Psychotherapy: Not currently participating in psychotherapy or in the past.   Legal: Denies legal at this time.  Past Medical History:  Past Medical History:  Diagnosis  Date   Anemia    Anxiety    Asthma     Past Surgical History:  Procedure Laterality Date   left arm surgery Left    tonsillectcomy      Family Psychiatric History: Endorses mother having anxiety.   Family History:  Family History  Problem Relation Age of Onset   Anemia Mother    Sarcoidosis Maternal Grandmother     Social History:  Social History   Socioeconomic History   Marital status: Married    Spouse name: Not on file   Number of children: Not on file   Years of education: Not on file   Highest education level: High school graduate  Occupational History   Not on file  Tobacco Use   Smoking status: Never   Smokeless tobacco: Never  Vaping Use   Vaping status: Never Used  Substance and Sexual Activity   Alcohol use: Not Currently    Comment: occasional   Drug use: Yes    Types: Marijuana   Sexual activity: Yes    Birth control/protection: Condom  Other Topics Concern   Not on file  Social History Narrative   Not on file   Social Drivers of Health   Financial Resource Strain: Medium Risk (07/07/2023)   Received from [facility]   Overall Financial Resource Strain (CARDIA)    Difficulty of Paying Living Expenses: Somewhat hard  Food Insecurity: Food Insecurity Present (07/07/2023)   Received from [facility]   Hunger Vital Sign    Worried About Running Out of Food in the Last Year: Never true    Ran Out of Food in the Last Year: Sometimes true  Transportation Needs: No Transportation Needs (07/07/2023)   Received from [facility] - Transportation    In the past 12 months, has lack of transportation kept you from medical appointments or from getting medications?: No    Lack of Transportation (Non-Medical): No  Physical Activity: Not on file  Stress: Not on file  Social Connections: Not on file    Allergies:  Allergies  Allergen Reactions   Shellfish Allergy Anaphylaxis and Nausea And  Vomiting   Banana    Cranberry Itching    All berries   Other Other (See Comments)    Metabolic Disorder Labs: No results found for: "HGBA1C", "MPG" No results found for: "PROLACTIN" No results found for: "CHOL", "TRIG", "HDL", "CHOLHDL", "VLDL", "LDLCALC" Lab Results  Component Value Date   TSH 1.429 10/01/2020    Therapeutic Level Labs: No results found for: "LITHIUM" No results found for: "VALPROATE" No results found for: "CBMZ"  Current Medications: Current Outpatient Medications  Medication Sig Dispense Refill   albuterol  (VENTOLIN  HFA) 108 (90 Base) MCG/ACT inhaler Inhale 2 puffs into the lungs every 6 (six) hours as needed for wheezing or shortness of breath. 8 g 0   cetirizine (ZYRTEC) 10 MG tablet Take 1 tablet by mouth daily.     clobetasol ointment (  TEMOVATE) 0.05 % Apply topically 2 (two) times daily.     clotrimazole -betamethasone  (LOTRISONE ) cream Apply 1 Application topically 2 (two) times daily. 30 g 0   EPINEPHrine  0.3 mg/0.3 mL IJ SOAJ injection Inject 0.3 mLs into the muscle as needed.     FEROSUL 325 (65 Fe) MG tablet Take 325 mg by mouth every morning.     ibuprofen  (ADVIL ) 600 MG tablet Take 1 tablet (600 mg total) by mouth every 8 (eight) hours as needed. With food 30 tablet 0   meloxicam  (MOBIC ) 15 MG tablet Take 1 tablet (15 mg total) by mouth daily. (Patient not taking: Reported on 07/05/2023) 30 tablet 0   methocarbamol  (ROBAXIN ) 500 MG tablet Take 1 tablet (500 mg total) by mouth 4 (four) times daily. (Patient not taking: Reported on 07/05/2023) 16 tablet 0   sertraline  (ZOLOFT ) 25 MG tablet Take 25 mg by mouth daily.     sertraline  (ZOLOFT ) 25 MG tablet Take 1 tablet (25 mg total) by mouth daily. 30 tablet 3   terbinafine  (LAMISIL ) 250 MG tablet Take 1 tablet (250 mg total) by mouth daily. (Patient not taking: Reported on 07/05/2023) 90 tablet 0   traZODone  (DESYREL ) 50 MG tablet Take 1 tablet (50 mg total) by mouth at bedtime. 30 tablet 0   Vitamin D,  Ergocalciferol, (DRISDOL) 1.25 MG (50000 UNIT) CAPS capsule Take 50,000 Units by mouth every 7 (seven) days. (Patient not taking: Reported on 07/05/2023)     No current facility-administered medications for this visit.     Musculoskeletal: Strength & Muscle Tone: within normal limits Gait & Station: normal Patient leans: N/A  Psychiatric Specialty Exam: Review of Systems  Constitutional: Negative.   HENT: Negative.    Eyes: Negative.   Respiratory: Negative.    Cardiovascular: Negative.   Gastrointestinal: Negative.   Endocrine: Negative.   Genitourinary: Negative.   Musculoskeletal: Negative.   Allergic/Immunologic: Negative.   Neurological: Negative.   Hematological: Negative.   Psychiatric/Behavioral:  Positive for dysphoric mood. The patient is nervous/anxious.     There were no vitals taken for this visit.There is no height or weight on file to calculate BMI.  General Appearance: Well Groomed  Eye Contact:  Good  Speech:  Clear and Coherent  Volume:  Normal  Mood:  Euthymic  Affect:  Appropriate  Thought Process:  Coherent  Orientation:  Full (Time, Place, and Person)  Thought Content: Logical   Suicidal Thoughts:  No  Homicidal Thoughts:  No  Memory:  Immediate;   Good Recent;   Good Remote;   Good  Judgement:  Good  Insight:  Good  Psychomotor Activity:  Normal  Concentration:  Concentration: Good and Attention Span: Good  Recall:  Good  Fund of Knowledge: Good  Language: Good  Akathisia:  No  Handed:  Right  AIMS (if indicated):   Assets:  Desire for Improvement Housing Vocational/Educational  ADL's:  Intact  Cognition: WNL  Sleep:  Good   Screenings: PHQ2-9    Flowsheet Row Office Visit from 07/05/2023 in [facility]  PHQ-2 Total Score 6  PHQ-9 Total Score 21      Flowsheet Row Office Visit from 07/05/2023 in [facility] ED from 02/12/2023 in [facility] Emergency  Department at [facility] ED from 08/05/2022 in [facility] Emergency Department at [facility]  C-SSRS RISK CATEGORY No Risk No Risk No Risk        Assessment and Plan:   Assessment -  Diagnosis: Generalized anxiety disorder [F41.1]  2. MDD (major depressive disorder), recurrent episode, moderate (HCC) [F33.1]  3. 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 Cannabis use disorder [F12.90]  Differential Diagnosis: Bipolar Disorder 2. OCD - Progress: Pt reporting continued problems with the spouse, in which drinking in which she shared her concerns with no improvement.  - Risk Factors: Worsening symptoms.  Plan - Medications:   Increase sertraline   to 50 mg by mouth once a day, patient advised that should the patient experience confusion, tremors or palpitations to stop the medication and contact the clinic.  Patient educated on serotonin syndrome due to taking trazodone  and sertraline  together.  Patient has been advised about possible sexual dysfunction as well as GI irritability and has been instructed to contact clinic should these symptoms occur. Start trazodone  50 mg by mouth once a day at night before bed, patient advised of the sedation and has instructed to take it before bed and not to drive while on medication patient was also educated on GI irritability as well as constipation and dry mouth.  Patient educated to contact the clinic should she have symptoms lasting longer than a week. - Psychotherapy: Patient was provided a therapy referral list and was recommended to contact a therapist and develop a rapport and follow recommended treatment plan, for anxiety like behaviors which could include OCD-like tendencies. - Education: Patient educated on medications purpose as well as cannabis use.  Patient was educated on the relationship between cannabis and panic attacks as associated by the NIH.  The patient was encouraged to decrease the amount of marijuana use and  to monitor her symptoms discussed on her next visit from reduction of marijuana use and which she is in agreement with. - Follow-Up: Patient will follow up in 1 month - Referrals: Therapy referral list provided. - Safety Planning: Patient was educated to call 911 or go to the emergency department should she have any suicidal thoughts with or without a plan.  Patient was also educated to contact the clinic should she have worsening symptoms before next visit.  Patient denies having a firearm within the home.    Patient/Guardian was advised Release of Information must be obtained prior to any record release in order to collaborate their care with an outside provider. Patient/Guardian was advised if they have not already done so to contact the registration department to sign all necessary forms in order for us  to release information regarding their care.   Consent: Patient/Guardian gives verbal consent for treatment and assignment of benefits for services provided during this visit. Patient/Guardian expressed understanding and agreed to proceed.    Sharon Labor, NP 08/01/2023, 9:38 AM

## 2023-08-01 NOTE — Addendum Note (Signed)
 Addended by: Sharnette Kitamura on: 08/01/2023 02:09 PM   Modules accepted: Level of Service

## 2023-08-04 DIAGNOSIS — M5416 Radiculopathy, lumbar region: Secondary | ICD-10-CM | POA: Diagnosis not present

## 2023-08-08 DIAGNOSIS — Z419 Encounter for procedure for purposes other than remedying health state, unspecified: Secondary | ICD-10-CM | POA: Diagnosis not present

## 2023-08-09 DIAGNOSIS — M5416 Radiculopathy, lumbar region: Secondary | ICD-10-CM | POA: Diagnosis not present

## 2023-08-09 DIAGNOSIS — M6281 Muscle weakness (generalized): Secondary | ICD-10-CM | POA: Diagnosis not present

## 2023-08-15 DIAGNOSIS — M5416 Radiculopathy, lumbar region: Secondary | ICD-10-CM | POA: Diagnosis not present

## 2023-08-29 ENCOUNTER — Telehealth (INDEPENDENT_AMBULATORY_CARE_PROVIDER_SITE_OTHER): Admitting: Psychiatry

## 2023-08-29 DIAGNOSIS — F411 Generalized anxiety disorder: Secondary | ICD-10-CM | POA: Insufficient documentation

## 2023-08-29 DIAGNOSIS — F331 Major depressive disorder, recurrent, moderate: Secondary | ICD-10-CM | POA: Diagnosis not present

## 2023-08-29 DIAGNOSIS — F1290 Cannabis use, unspecified, uncomplicated: Secondary | ICD-10-CM

## 2023-08-29 DIAGNOSIS — F5105 Insomnia due to other mental disorder: Secondary | ICD-10-CM | POA: Diagnosis not present

## 2023-08-29 DIAGNOSIS — F99 Mental disorder, not otherwise specified: Secondary | ICD-10-CM | POA: Diagnosis not present

## 2023-08-29 DIAGNOSIS — F329 Major depressive disorder, single episode, unspecified: Secondary | ICD-10-CM | POA: Insufficient documentation

## 2023-08-29 MED ORDER — TRAZODONE HCL 50 MG PO TABS: 50.0000 mg | ORAL_TABLET | Freq: Every day | ORAL | 2 refills | Status: DC

## 2023-08-29 MED ORDER — SERTRALINE HCL 50 MG PO TABS: 50.0000 mg | ORAL_TABLET | Freq: Every day | ORAL | 2 refills | Status: DC

## 2023-08-29 NOTE — Progress Notes (Addendum)
 BH MD/PA/NP OP Progress Note  08/29/2023 9:29 AM Sharon Salazar  MRN:  [id number]  Chief Complaint: Routine follow-up  Virtual Visit via Video Note  I connected with Sharon Salazar on 08/29/23 at  9:30 AM EDT by a video enabled telemedicine application and verified that I am speaking with the correct person using two identifiers.  Location: Patient: [address]  [name] [address]  Provider: [facility] Office Provider   I discussed the limitations of evaluation and management by telemedicine and the availability of in person appointments. The patient expressed understanding and agreed to proceed.    I discussed the assessment and treatment plan with the patient. The patient was provided an opportunity to ask questions and all were answered. The patient agreed with the plan and demonstrated an understanding of the instructions.   The patient was advised to call back or seek an in-person evaluation if the symptoms worsen or if the condition fails to improve as anticipated.  I provided 30 minutes of non-face-to-face time during this encounter.   Arlana Labor, NP    HPI: 33 year old female presents to [facility] for follow-up. Pt reports today the she feels that her symptoms are being controlled with the medications.  She reports she feels better with her work, stating that recently she got a new car, which removes significant situational stressors.  She also reports that she has no changes in her relationship which still causes considerable stress.  She reports that she has not been able to participate in therapy, stating she has been too busy as she has to work to be able to afford the car.  Based on this assessment and interview pt will continue medication regimen of Sertraline  50mg  once daily and Trazadone 25-50mg  as needed for sleep.  Pt denies SI/HI/AVH.  Pt with no questions or concerns at this time.  Pt is in agreement with treatment plan. Pt will follow-up in 3 months.   Visit Diagnosis:    ICD-10-CM   1. Generalized anxiety disorder  F41.1     2. MDD (major depressive disorder), recurrent episode, moderate (HCC)  F33.1 sertraline  (ZOLOFT ) 50 MG tablet    3. Insomnia due to other mental disorder  F51.05 traZODone  (DESYREL ) 50 MG tablet   F99       Past Psychiatric History:  Previous Psych Hospitalizations: 33 years old, [facility], diagnosed with bipolar disorder according to patient based on her behavior with her mother at the time.  No hospitalizations after 9 being 19.   Outpatient treatment: The patient being treated at her PCP by Dr. Harwood Lingo.   Medications Current:  -Sertraline  50 mg once daily -Trazadone 25-50mg  once daily as needed for sleep.    Medication Trials: Fluoxetine discontinued in 2023 due to side effects.   Suicide & Violence: Denies SI, HI.  Denies any previous violence behaviors.   Psychotherapy: Not currently participating in psychotherapy or in the past.   Legal: Denies legal at this time.  Past Medical History:  Past Medical History:  Diagnosis Date   Anemia    Anxiety    Asthma     Past Surgical History:  Procedure Laterality Date   left arm surgery Left    tonsillectcomy      Family Psychiatric History: Endorses mother having anxiety.   Family History:  Family History  Problem Relation Age of Onset   Anemia Mother    Sarcoidosis Maternal Grandmother     Social History:  Social History   Socioeconomic History   Marital status: Married    Spouse name: Not on file   Number of children: Not on file   Years of education: Not on file   Highest education level: High school graduate  Occupational History   Not on file  Tobacco Use   Smoking status: Never   Smokeless tobacco: Never  Vaping Use   Vaping status: Never Used  Substance and Sexual Activity   Alcohol use: Not Currently    Comment: occasional   Drug use: Yes    Types: Marijuana   Sexual activity: Yes    Birth  control/protection: Condom  Other Topics Concern   Not on file  Social History Narrative   Not on file   Social Drivers of Health   Financial Resource Strain: Medium Risk (08/12/2023)   Received from [facility]   Overall Financial Resource Strain (CARDIA)    Difficulty of Paying Living Expenses: Somewhat hard  Food Insecurity: Food Insecurity Present (08/12/2023)   Received from [facility]   Hunger Vital Sign    Worried About Running Out of Food in the Last Year: Sometimes true    Ran Out of Food in the Last Year: Sometimes true  Transportation Needs: Unmet Transportation Needs (08/12/2023)   Received from [facility] - Transportation    In the past 12 months, has lack of transportation kept you from medical appointments or from getting medications?: No    Lack of Transportation (Non-Medical): Yes  Physical Activity: Insufficiently Active (08/12/2023)   Received from [facility]   Exercise Vital Sign    Days of Exercise per Week: 2 days    Minutes of Exercise per Session: 20 min  Stress: Stress Concern Present (08/12/2023)   Received from [facility] - Occupational Stress Questionnaire    Feeling of Stress : Very much  Social Connections: Moderately Isolated (08/12/2023)   Received from [facility]   Social Connection and Isolation Panel [NHANES]    Frequency of Communication with Friends and Family: Once a week    Frequency of Social Gatherings with Friends and Family: Once a week    Attends Religious Services: 1 to 4 times per year    Active Member of [employer] or Organizations: No    Attends Banker Meetings: Never    Marital Status: Married    Allergies:  Allergies  Allergen Reactions   Shellfish Allergy Anaphylaxis and Nausea And Vomiting   Banana    Cranberry Itching    All berries   Other Other  (See Comments)    Metabolic Disorder Labs: No results found for: "HGBA1C", "MPG" No results found for: "PROLACTIN" No results found for: "CHOL", "TRIG", "HDL", "CHOLHDL", "VLDL", "LDLCALC" Lab Results  Component Value Date   TSH 1.429 10/01/2020    Therapeutic Level Labs: No results found for: "LITHIUM" No results found for: "VALPROATE" No results found for: "CBMZ"  Current Medications: Current Outpatient Medications  Medication Sig Dispense Refill   albuterol  (VENTOLIN  HFA) 108 (90 Base) MCG/ACT inhaler Inhale 2 puffs into the lungs every 6 (six) hours as needed for wheezing or shortness of breath. 8 g 0   cetirizine (ZYRTEC) 10 MG tablet Take 1 tablet by mouth daily.     clobetasol ointment (TEMOVATE) 0.05 % Apply topically 2 (two) times daily.     clotrimazole -betamethasone  (LOTRISONE ) cream Apply  1 Application topically 2 (two) times daily. 30 g 0   EPINEPHrine  0.3 mg/0.3 mL IJ SOAJ injection Inject 0.3 mLs into the muscle as needed.     FEROSUL 325 (65 Fe) MG tablet Take 325 mg by mouth every morning.     ibuprofen  (ADVIL ) 600 MG tablet Take 1 tablet (600 mg total) by mouth every 8 (eight) hours as needed. With food 30 tablet 0   sertraline  (ZOLOFT ) 50 MG tablet Take 1 tablet (50 mg total) by mouth daily. 30 tablet 2   traZODone  (DESYREL ) 50 MG tablet Take 1 tablet (50 mg total) by mouth at bedtime. 30 tablet 0   No current facility-administered medications for this visit.     Musculoskeletal: Strength & Muscle Tone: within normal limits Gait & Station: normal Patient leans: N/A  Psychiatric Specialty Exam: Review of Systems  Constitutional: Negative.   HENT: Negative.    Eyes: Negative.   Respiratory: Negative.      There were no vitals taken for this visit.There is no height or weight on file to calculate BMI.  General Appearance: Well Groomed  Eye Contact:  Good  Speech:  Clear and Coherent  Volume:  Normal  Mood:  Euthymic  Affect:  Appropriate  Thought  Process:  Coherent  Orientation:  Full (Time, Place, and Person)  Thought Content: Logical   Suicidal Thoughts:  No  Homicidal Thoughts:  No  Memory:  Immediate;   Good Recent;   Good Remote;   Good  Judgement:  Good  Insight:  Good  Psychomotor Activity:  Normal  Concentration:  Concentration: Good and Attention Span: Good  Recall:  Good  Fund of Knowledge: Good  Language: Good  Akathisia:  No  Handed:  Right  AIMS (if indicated):   Assets:  Desire for Improvement Financial Resources/Insurance Housing Vocational/Educational  ADL's:  Intact  Cognition: WNL  Sleep:  Fair   Screenings: PHQ2-9    Flowsheet Row Video Visit from 08/01/2023 in [facility] Office Visit from 07/05/2023 in [facility]  PHQ-2 Total Score 6 6  PHQ-9 Total Score 24 21      Flowsheet Row Video Visit from 08/01/2023 in [facility] Office Visit from 07/05/2023 in [facility] ED from 02/12/2023 in [facility] Department at [facility]  C-SSRS RISK CATEGORY No Risk No Risk No Risk        Assessment and Plan:  Assessment - Diagnosis: Generalized anxiety disorder [F41.1]  2. MDD (major depressive disorder), recurrent episode, moderate (HCC) [F33.1]  3. 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 Cannabis use disorder [F12.90]  Differential Diagnosis: Bipolar Disorder 2. OCD - Progress: Pt reporting continued problems with the spouse, in which drinking in which she shared her concerns with no improvement. Pt does endorse improve in symptom management, stating she is satisfied with the current medication regimen.  - Risk Factors: Worsening symptoms.  Plan - Medications:   Continue sertraline  50 mg by mouth once a day, patient advised that should the patient experience confusion, tremors or palpitations to stop the  medication and contact the clinic.  Patient educated on serotonin syndrome due to taking trazodone  and sertraline  together.  Patient has been advised about possible sexual dysfunction as well as GI irritability and has been instructed to contact clinic should these symptoms occur. Continue trazodone  25 to 50 mg by mouth once a day at night before  bed, patient advised of the sedation and has instructed to take it before bed and not to drive while on medication patient was also educated on GI irritability as well as constipation and dry mouth.  Patient educated to contact the clinic should she have symptoms lasting longer than a week. - Psychotherapy: Patient was provided a therapy referral list and was recommended to contact a therapist and develop a rapport and follow recommended treatment plan, for anxiety like behaviors which could include OCD-like tendencies. - Education: Patient educated on medications purpose as well as cannabis use.  Patient was educated on the relationship between cannabis and panic attacks as associated by the NIH.  The patient was encouraged to decrease the amount of marijuana use and to monitor her symptoms discussed on her next visit from reduction of marijuana use and which she is in agreement with. - Follow-Up: Patient will follow up in 3 months. - Referrals: Therapy referral list provided. - Safety Planning: Patient was educated to call 911 or go to the emergency department should she have any suicidal thoughts with or without a plan.  Patient was also educated to contact the clinic should she have worsening symptoms before next visit.  Patient denies having a firearm within the home.    Patient/Guardian was advised Release of Information must be obtained prior to any record release in order to collaborate their care with an outside provider. Patient/Guardian was advised if they have not already done so to contact the registration department to sign all necessary forms in  order for us  to release information regarding their care.   Consent: Patient/Guardian gives verbal consent for treatment and assignment of benefits for services provided during this visit. Patient/Guardian expressed understanding and agreed to proceed.    Arlana Labor, NP 08/29/2023, 9:29 AM

## 2023-08-29 NOTE — Addendum Note (Signed)
 Addended by: Gustave Lindeman on: 08/29/2023 04:47 PM   Modules accepted: Level of Service

## 2023-08-31 DIAGNOSIS — M7918 Myalgia, other site: Secondary | ICD-10-CM | POA: Diagnosis not present

## 2023-08-31 DIAGNOSIS — M1611 Unilateral primary osteoarthritis, right hip: Secondary | ICD-10-CM | POA: Diagnosis not present

## 2023-08-31 DIAGNOSIS — M5416 Radiculopathy, lumbar region: Secondary | ICD-10-CM | POA: Diagnosis not present

## 2023-09-01 ENCOUNTER — Encounter: Payer: Self-pay | Admitting: Oncology

## 2023-09-01 ENCOUNTER — Other Ambulatory Visit: Payer: Self-pay | Admitting: Family Medicine

## 2023-09-01 DIAGNOSIS — S3991XA Unspecified injury of abdomen, initial encounter: Secondary | ICD-10-CM

## 2023-09-07 DIAGNOSIS — M5416 Radiculopathy, lumbar region: Secondary | ICD-10-CM | POA: Diagnosis not present

## 2023-09-07 DIAGNOSIS — M6281 Muscle weakness (generalized): Secondary | ICD-10-CM | POA: Diagnosis not present

## 2023-09-08 DIAGNOSIS — Z419 Encounter for procedure for purposes other than remedying health state, unspecified: Secondary | ICD-10-CM | POA: Diagnosis not present

## 2023-09-12 DIAGNOSIS — M5459 Other low back pain: Secondary | ICD-10-CM | POA: Diagnosis not present

## 2023-09-12 DIAGNOSIS — R1013 Epigastric pain: Secondary | ICD-10-CM | POA: Diagnosis not present

## 2023-09-12 DIAGNOSIS — F411 Generalized anxiety disorder: Secondary | ICD-10-CM | POA: Diagnosis not present

## 2023-09-12 DIAGNOSIS — F319 Bipolar disorder, unspecified: Secondary | ICD-10-CM | POA: Diagnosis not present

## 2023-09-12 DIAGNOSIS — D649 Anemia, unspecified: Secondary | ICD-10-CM | POA: Diagnosis not present

## 2023-09-12 DIAGNOSIS — G8929 Other chronic pain: Secondary | ICD-10-CM | POA: Diagnosis not present

## 2023-09-19 DIAGNOSIS — X58XXXA Exposure to other specified factors, initial encounter: Secondary | ICD-10-CM | POA: Diagnosis not present

## 2023-09-19 DIAGNOSIS — S3991XA Unspecified injury of abdomen, initial encounter: Secondary | ICD-10-CM | POA: Diagnosis not present

## 2023-09-22 DIAGNOSIS — M5380 Other specified dorsopathies, site unspecified: Secondary | ICD-10-CM | POA: Diagnosis not present

## 2023-09-22 DIAGNOSIS — S3991XA Unspecified injury of abdomen, initial encounter: Secondary | ICD-10-CM | POA: Diagnosis not present

## 2023-09-22 DIAGNOSIS — M4802 Spinal stenosis, cervical region: Secondary | ICD-10-CM | POA: Diagnosis not present

## 2023-09-22 DIAGNOSIS — M5416 Radiculopathy, lumbar region: Secondary | ICD-10-CM | POA: Diagnosis not present

## 2023-09-22 DIAGNOSIS — Y9379 Activity, other specified sports and athletics: Secondary | ICD-10-CM | POA: Diagnosis not present

## 2023-09-22 DIAGNOSIS — M1611 Unilateral primary osteoarthritis, right hip: Secondary | ICD-10-CM | POA: Diagnosis not present

## 2023-10-05 ENCOUNTER — Other Ambulatory Visit: Payer: Self-pay | Admitting: Family Medicine

## 2023-10-05 DIAGNOSIS — M4802 Spinal stenosis, cervical region: Secondary | ICD-10-CM

## 2023-10-06 ENCOUNTER — Ambulatory Visit
Admission: RE | Admit: 2023-10-06 | Discharge: 2023-10-06 | Disposition: A | Discharge status: Home / Self Care | Source: Ambulatory Visit | Attending: Family Medicine | Admitting: Family Medicine

## 2023-10-06 DIAGNOSIS — M5021 Other cervical disc displacement,  high cervical region: Secondary | ICD-10-CM | POA: Diagnosis not present

## 2023-10-06 DIAGNOSIS — M50222 Other cervical disc displacement at C5-C6 level: Secondary | ICD-10-CM | POA: Diagnosis not present

## 2023-10-06 DIAGNOSIS — M4802 Spinal stenosis, cervical region: Secondary | ICD-10-CM

## 2023-10-06 DIAGNOSIS — M50223 Other cervical disc displacement at C6-C7 level: Secondary | ICD-10-CM | POA: Diagnosis not present

## 2023-10-08 DIAGNOSIS — Z419 Encounter for procedure for purposes other than remedying health state, unspecified: Secondary | ICD-10-CM | POA: Diagnosis not present

## 2023-10-17 DIAGNOSIS — R251 Tremor, unspecified: Secondary | ICD-10-CM | POA: Diagnosis not present

## 2023-10-17 DIAGNOSIS — M5412 Radiculopathy, cervical region: Secondary | ICD-10-CM | POA: Diagnosis not present

## 2023-10-17 DIAGNOSIS — M5416 Radiculopathy, lumbar region: Secondary | ICD-10-CM | POA: Diagnosis not present

## 2023-10-17 DIAGNOSIS — M4802 Spinal stenosis, cervical region: Secondary | ICD-10-CM | POA: Diagnosis not present

## 2023-10-27 DIAGNOSIS — R202 Paresthesia of skin: Secondary | ICD-10-CM | POA: Diagnosis not present

## 2023-11-08 DIAGNOSIS — Z419 Encounter for procedure for purposes other than remedying health state, unspecified: Secondary | ICD-10-CM | POA: Diagnosis not present

## 2023-11-21 ENCOUNTER — Inpatient Hospital Stay: Payer: Medicaid Other | Attending: Oncology

## 2023-11-21 DIAGNOSIS — Z79899 Other long term (current) drug therapy: Secondary | ICD-10-CM | POA: Diagnosis not present

## 2023-11-21 DIAGNOSIS — D509 Iron deficiency anemia, unspecified: Secondary | ICD-10-CM | POA: Diagnosis not present

## 2023-11-21 LAB — CBC WITH DIFFERENTIAL (CANCER CENTER ONLY)
Abs Immature Granulocytes: 0.02 K/uL (ref 0.00–0.07)
Basophils Absolute: 0 K/uL (ref 0.0–0.1)
Basophils Relative: 1 %
Eosinophils Absolute: 0.1 K/uL (ref 0.0–0.5)
Eosinophils Relative: 2 %
HCT: 35.4 % — ABNORMAL LOW (ref 36.0–46.0)
Hemoglobin: 11.7 g/dL — ABNORMAL LOW (ref 12.0–15.0)
Immature Granulocytes: 0 %
Lymphocytes Relative: 24 %
Lymphs Abs: 1.3 K/uL (ref 0.7–4.0)
MCH: 30.5 pg (ref 26.0–34.0)
MCHC: 33.1 g/dL (ref 30.0–36.0)
MCV: 92.2 fL (ref 80.0–100.0)
Monocytes Absolute: 0.6 K/uL (ref 0.1–1.0)
Monocytes Relative: 11 %
Neutro Abs: 3.4 K/uL (ref 1.7–7.7)
Neutrophils Relative %: 62 %
Platelet Count: 248 K/uL (ref 150–400)
RBC: 3.84 MIL/uL — ABNORMAL LOW (ref 3.87–5.11)
RDW: 13.1 % (ref 11.5–15.5)
WBC Count: 5.4 K/uL (ref 4.0–10.5)
nRBC: 0 % (ref 0.0–0.2)

## 2023-11-21 LAB — IRON AND TIBC
Iron: 56 ug/dL (ref 28–170)
Saturation Ratios: 14 % (ref 10.4–31.8)
TIBC: 400 ug/dL (ref 250–450)
UIBC: 344 ug/dL

## 2023-11-21 LAB — FERRITIN: Ferritin: 5 ng/mL — ABNORMAL LOW (ref 11–307)

## 2023-11-21 LAB — PREGNANCY, URINE: Preg Test, Ur: NEGATIVE

## 2023-11-24 ENCOUNTER — Inpatient Hospital Stay (HOSPITAL_BASED_OUTPATIENT_CLINIC_OR_DEPARTMENT_OTHER): Payer: Medicaid Other | Admitting: Oncology

## 2023-11-24 ENCOUNTER — Encounter: Payer: Self-pay | Admitting: Oncology

## 2023-11-24 ENCOUNTER — Inpatient Hospital Stay: Payer: Medicaid Other

## 2023-11-24 VITALS — BP 107/74 | HR 68 | Temp 99.3°F | Resp 16 | Wt 152.0 lb

## 2023-11-24 VITALS — BP 113/62 | HR 58

## 2023-11-24 DIAGNOSIS — D509 Iron deficiency anemia, unspecified: Secondary | ICD-10-CM

## 2023-11-24 DIAGNOSIS — R21 Rash and other nonspecific skin eruption: Secondary | ICD-10-CM | POA: Diagnosis not present

## 2023-11-24 DIAGNOSIS — Z79899 Other long term (current) drug therapy: Secondary | ICD-10-CM | POA: Diagnosis not present

## 2023-11-24 DIAGNOSIS — R1084 Generalized abdominal pain: Secondary | ICD-10-CM | POA: Diagnosis not present

## 2023-11-24 DIAGNOSIS — J040 Acute laryngitis: Secondary | ICD-10-CM | POA: Diagnosis not present

## 2023-11-24 MED ORDER — IRON SUCROSE 20 MG/ML IV SOLN
200.0000 mg | Freq: Once | INTRAVENOUS | Status: AC
  Administered 2023-11-24: 200 mg via INTRAVENOUS

## 2023-11-24 NOTE — Assessment & Plan Note (Addendum)
 Labs are reviewed and discussed with patient. Lab Results  Component Value Date   HGB 11.7 (L) 11/21/2023   TIBC 400 11/21/2023   IRONPCTSAT 14 11/21/2023   FERRITIN 5 (L) 11/21/2023   Hb is decreased  Ferritin remain decreased.  Venofer  weekly x 4 Patient is sexually active and not on any contraceptives.  Recommend pregnancy testing prior to Venofer  treatments.

## 2023-11-24 NOTE — Patient Instructions (Signed)
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
## 2023-12-02 DIAGNOSIS — F411 Generalized anxiety disorder: Secondary | ICD-10-CM | POA: Diagnosis not present

## 2023-12-05 ENCOUNTER — Telehealth (INDEPENDENT_AMBULATORY_CARE_PROVIDER_SITE_OTHER): Admitting: Psychiatry

## 2023-12-05 DIAGNOSIS — F331 Major depressive disorder, recurrent, moderate: Secondary | ICD-10-CM | POA: Diagnosis not present

## 2023-12-05 DIAGNOSIS — F411 Generalized anxiety disorder: Secondary | ICD-10-CM

## 2023-12-05 MED ORDER — SERTRALINE HCL 50 MG PO TABS: 50.0000 mg | ORAL_TABLET | Freq: Every day | ORAL | 1 refills | Status: DC

## 2023-12-05 NOTE — Progress Notes (Signed)
 [facility] MD/PA/NP OP Progress Note  12/05/2023 9:32 AM Stefan Karen  MRN:  [id number]  Chief Complaint: Routine Follow-up  Virtual Visit via Video Note  I connected with Sharon Salazar on 12/05/23 at  9:30 AM EDT by a video enabled telemedicine application and verified that I am speaking with the correct person using two identifiers.  Location: Patient: [name] [address]  Provider: [facility] [place]   I discussed the limitations of evaluation and management by telemedicine and the availability of in person appointments. The patient expressed understanding and agreed to proceed.    I discussed the assessment and treatment plan with the patient. The patient was provided an opportunity to ask questions and all were answered. The patient agreed with the plan and demonstrated an understanding of the instructions.   The patient was advised to call back or seek an in-person evaluation if the symptoms worsen or if the condition fails to improve as anticipated.  I provided 30 minutes of non-face-to-face time during this encounter.   Dorn Jama Der, NP   HPI: 33 year old female presenting [facility] for follow-up.  Today patient presents that she is having depressive symptoms as well as anxiety symptoms with sadness hopelessness and frustration as she is not doing well at work stating that the season is slow right now she is not having enough haircuts to cover her bills and her relationship issues.  Patient reports that she stopped using trazodone  and is only taking sertraline  50 mg once daily to help manage her symptoms as well as cannabis use.  Patient reports that she has been using cannabis to try to use to help with symptoms but patient has been educated of the psychoactive nature of the medication and has been encouraged to decrease her use and eventually stop.  Patient verbalized understanding and agreement and states she will try her best.  Patient reports that  she did find a therapist but was provided a referral to IOP at [facility] in which she will start this week or next week.  Patient reports that she is nervous about it but states that she feels she could benefit from it and would like to start with individual therapist as well.  Patient has been educated to spend some time with the therapist and if she is enjoying it to continue or if she is not feeling well with therapy to let this provider know so she can be placed with another therapist within the recommendations of the provider.  When asked about medication patient states she wants to continue taking 50 mg of sertraline  once daily.  Patient with no other questions or concerns at this time.  Patient is in agreement with treatment plan.  Patient denies SI, HI, AVH.  Patient to follow-up in 1 month. Visit Diagnosis:    ICD-10-CM   1. Generalized anxiety disorder  F41.1     2. MDD (major depressive disorder), recurrent episode, moderate (HCC)  F33.1       Past Psychiatric History:  Previous Psych Hospitalizations: 33 years old, [facility], diagnosed with bipolar disorder according to patient based on her behavior with her mother at the time.  No hospitalizations after 9 being 19.   Outpatient treatment: The patient being treated at her PCP by Dr. Epifanio.   Medications Current:  -Sertraline  50 mg once daily    Medication Trials: Fluoxetine discontinued in 2023 due to side effects.   Suicide & Violence: Denies SI, HI.  Denies any previous violence behaviors.   Psychotherapy: Not currently participating in psychotherapy or in the past.   Legal: Denies legal at this time.  Past Medical History:  Past Medical History:  Diagnosis Date   Anemia    Anxiety    Asthma     Past Surgical History:  Procedure Laterality Date   left arm surgery Left    tonsillectcomy      Family Psychiatric History: No additional   Family History:  Family History  Problem Relation Age of Onset    Anemia Mother    Sarcoidosis Maternal Grandmother     Social History:  Social History   Socioeconomic History   Marital status: Married    Spouse name: Not on file   Number of children: Not on file   Years of education: Not on file   Highest education level: High school graduate  Occupational History   Not on file  Tobacco Use   Smoking status: Never   Smokeless tobacco: Never  Vaping Use   Vaping status: Never Used  Substance and Sexual Activity   Alcohol use: Not Currently    Comment: occasional   Drug use: Yes    Types: Marijuana   Sexual activity: Yes    Birth control/protection: Condom  Other Topics Concern   Not on file  Social History Narrative   Not on file   Social Drivers of Health   Financial Resource Strain: Medium Risk (08/12/2023)   Received from [facility]   Overall Financial Resource Strain (CARDIA)    Difficulty of Paying Living Expenses: Somewhat hard  Food Insecurity: Food Insecurity Present (08/12/2023)   Received from [facility]   Hunger Vital Sign    Within the past 12 months, you worried that your food would run out before you got the money to buy more.: Sometimes true    Within the past 12 months, the food you bought just didn't last and you didn't have money to get more.: Sometimes true  Transportation Needs: Unmet Transportation Needs (08/12/2023)   Received from [facility] - Transportation    In the past 12 months, has lack of transportation kept you from medical appointments or from getting medications?: No    Lack of Transportation (Non-Medical): Yes  Physical Activity: Insufficiently Active (08/12/2023)   Received from [facility]   Exercise Vital Sign    On average, how many days per week do you engage in moderate to strenuous exercise (like a brisk walk)?: 2 days    On average, how many minutes do you engage in exercise at this level?: 20 min  Stress:  Stress Concern Present (08/12/2023)   Received from [facility] - Occupational Stress Questionnaire    Feeling of Stress : Very much  Social Connections: Moderately Isolated (08/12/2023)   Received from [facility]   Social Connection and Isolation Panel    In a typical week, how many times do you talk on the phone with family, friends, or neighbors?: Once a week    How often do you get together with friends or relatives?: Once a week    How often do you attend church or religious services?: 1 to 4 times per year    Do you belong to any clubs or organizations such as church groups, unions, fraternal or athletic groups, or school groups?: No    How  often do you attend meetings of the clubs or organizations you belong to?: Never    Are you married, widowed, divorced, separated, never married, or living with a partner?: Married    Allergies:  Allergies  Allergen Reactions   Shellfish Allergy Anaphylaxis and Nausea And Vomiting   Banana    Cranberry Itching    All berries   Other Other (See Comments)    Metabolic Disorder Labs: No results found for: HGBA1C, MPG No results found for: PROLACTIN No results found for: CHOL, TRIG, HDL, CHOLHDL, VLDL, LDLCALC Lab Results  Component Value Date   TSH 1.429 10/01/2020    Therapeutic Level Labs: No results found for: LITHIUM No results found for: VALPROATE No results found for: CBMZ  Current Medications: Current Outpatient Medications  Medication Sig Dispense Refill   albuterol  (VENTOLIN  HFA) 108 (90 Base) MCG/ACT inhaler Inhale 2 puffs into the lungs every 6 (six) hours as needed for wheezing or shortness of breath. 8 g 0   cetirizine (ZYRTEC) 10 MG tablet Take 1 tablet by mouth daily.     clobetasol ointment (TEMOVATE) 0.05 % Apply topically 2 (two) times daily.     clotrimazole -betamethasone  (LOTRISONE ) cream Apply 1 Application  topically 2 (two) times daily. 30 g 0   EPINEPHrine  0.3 mg/0.3 mL IJ SOAJ injection Inject 0.3 mLs into the muscle as needed.     FEROSUL 325 (65 Fe) MG tablet Take 325 mg by mouth every morning.     ibuprofen  (ADVIL ) 600 MG tablet Take 1 tablet (600 mg total) by mouth every 8 (eight) hours as needed. With food 30 tablet 0   sertraline  (ZOLOFT ) 50 MG tablet Take 1 tablet (50 mg total) by mouth daily. 30 tablet 2   traZODone  (DESYREL ) 50 MG tablet Take 1 tablet (50 mg total) by mouth at bedtime. 30 tablet 2   No current facility-administered medications for this visit.     Musculoskeletal: Strength & Muscle Tone: within normal limits Gait & Station: normal Patient leans: N/A   Psychiatric Specialty Exam: Review of Systems  Constitutional: Negative.   HENT: Negative.    Eyes: Negative.   Respiratory: Negative.       There were no vitals taken for this visit.There is no height or weight on file to calculate BMI.  General Appearance: Well Groomed  Eye Contact:  Good  Speech:  Clear and Coherent  Volume:  Normal  Mood:  Euthymic  Affect:  Appropriate  Thought Process:  Coherent  Orientation:  Full (Time, Place, and Person)  Thought Content: Logical   Suicidal Thoughts:  No  Homicidal Thoughts:  No  Memory:  Immediate;   Good Recent;   Good Remote;   Good  Judgement:  Good  Insight:  Good  Psychomotor Activity:  Normal  Concentration:  Concentration: Good and Attention Span: Good  Recall:  Good  Fund of Knowledge: Good  Language: Good  Akathisia:  No  Handed:  Right  AIMS (if indicated):   Assets:  Desire for Improvement Financial Resources/Insurance Housing Vocational/Educational  ADL's:  Intact  Cognition: WNL  Sleep:  Fair   Screenings: GAD-7    Flowsheet Row Video Visit from 08/29/2023 in [facility]  Total GAD-7 Score 10   PHQ2-9    Flowsheet Row Office Visit from 11/24/2023 in [facility]  . [facility] Video Visit from 08/29/2023 in [facility] Video Visit from  08/01/2023 in [facility] Office Visit from 07/05/2023 in [facility]  PHQ-2 Total Score 0 2 6 6   PHQ-9 Total Score -- 7 24 21    Flowsheet Row Video Visit from 08/01/2023 in [facility] Office Visit from 07/05/2023 in [facility] ED from 02/12/2023 in [facility] at [facility]  C-SSRS RISK CATEGORY No Risk No Risk No Risk     Assessment and Plan:  Assessment - Diagnosis: Generalized anxiety disorder [F41.1]  2. MDD (major depressive disorder), recurrent episode, moderate (HCC) [F33.1]  3. 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 Cannabis use disorder [F12.90]  Differential Diagnosis: Bipolar Disorder 2. OCD - Progress: Pt reporting continued problems with the spouse, in which drinking in which she shared her concerns with no improvement. Pt does endorse improve in symptom management, stating she is satisfied with the current medication regimen.  - Risk Factors: Worsening symptoms.  Plan - Medications:   Continue sertraline  50 mg by mouth once a day, patient advised that should the patient experience confusion, tremors or palpitations to stop the medication and contact the clinic.  Patient educated on serotonin syndrome due to taking trazodone  and sertraline  together.  Patient has been advised about possible sexual dysfunction as well as GI irritability and has been instructed to contact clinic should these symptoms occur. Stop trazadone. - Psychotherapy: Patient was provided a therapy referral list and was recommended to contact a therapist and develop a rapport and follow recommended treatment plan, for anxiety like behaviors which could include OCD-like  tendencies. - Education: Patient educated on medications purpose as well as cannabis use.  Patient was educated on the relationship between cannabis and panic attacks as associated by the NIH.  The patient was encouraged to decrease the amount of marijuana use and to monitor her symptoms discussed on her next visit from reduction of marijuana use and which she is in agreement with. - Follow-Up: Patient will follow up in 3 months. - Referrals: [facility] IOP program - Safety Planning: Patient was educated to call 911 or go to the emergency department should she have any suicidal thoughts with or without a plan.  Patient was also educated to contact the clinic should she have worsening symptoms before next visit.  Patient denies having a firearm within the home.  Patient/Guardian was advised Release of Information must be obtained prior to any record release in order to collaborate their care with an outside provider. Patient/Guardian was advised if they have not already done so to contact the registration department to sign all necessary forms in order for us  to release information regarding their care.   Consent: Patient/Guardian gives verbal consent for treatment and assignment of benefits for services provided during this visit. Patient/Guardian expressed understanding and agreed to proceed.    Dorn Jama Der, NP 12/05/2023, 9:32 AM

## 2023-12-06 ENCOUNTER — Inpatient Hospital Stay

## 2023-12-07 ENCOUNTER — Inpatient Hospital Stay

## 2023-12-07 ENCOUNTER — Inpatient Hospital Stay: Attending: Oncology

## 2023-12-07 VITALS — BP 103/62 | HR 54 | Temp 97.3°F | Resp 18

## 2023-12-07 DIAGNOSIS — D509 Iron deficiency anemia, unspecified: Secondary | ICD-10-CM | POA: Diagnosis not present

## 2023-12-07 LAB — PREGNANCY, URINE: Preg Test, Ur: NEGATIVE

## 2023-12-07 MED ORDER — IRON SUCROSE 20 MG/ML IV SOLN
200.0000 mg | Freq: Once | INTRAVENOUS | Status: AC
  Administered 2023-12-07: 200 mg via INTRAVENOUS
  Filled 2023-12-07: qty 10

## 2023-12-07 NOTE — Patient Instructions (Signed)

## 2023-12-09 ENCOUNTER — Encounter: Payer: Self-pay | Admitting: Oncology

## 2023-12-09 DIAGNOSIS — Z419 Encounter for procedure for purposes other than remedying health state, unspecified: Secondary | ICD-10-CM | POA: Diagnosis not present

## 2023-12-15 ENCOUNTER — Inpatient Hospital Stay

## 2023-12-15 VITALS — BP 102/64 | HR 62 | Temp 97.8°F | Resp 16

## 2023-12-15 DIAGNOSIS — D509 Iron deficiency anemia, unspecified: Secondary | ICD-10-CM

## 2023-12-15 LAB — PREGNANCY, URINE: Preg Test, Ur: NEGATIVE

## 2023-12-15 MED ORDER — SODIUM CHLORIDE 0.9% FLUSH
10.0000 mL | Freq: Once | INTRAVENOUS | Status: AC | PRN
  Administered 2023-12-15: 10 mL
  Filled 2023-12-15: qty 10

## 2023-12-15 MED ORDER — IRON SUCROSE 20 MG/ML IV SOLN
200.0000 mg | Freq: Once | INTRAVENOUS | Status: AC
  Administered 2023-12-15: 200 mg via INTRAVENOUS
  Filled 2023-12-15: qty 10

## 2023-12-22 ENCOUNTER — Inpatient Hospital Stay

## 2023-12-22 DIAGNOSIS — F331 Major depressive disorder, recurrent, moderate: Secondary | ICD-10-CM | POA: Diagnosis not present

## 2023-12-22 DIAGNOSIS — M5412 Radiculopathy, cervical region: Secondary | ICD-10-CM | POA: Diagnosis not present

## 2023-12-22 DIAGNOSIS — G250 Essential tremor: Secondary | ICD-10-CM | POA: Diagnosis not present

## 2023-12-23 ENCOUNTER — Other Ambulatory Visit: Payer: Self-pay | Admitting: Neurology

## 2023-12-23 DIAGNOSIS — G35 Multiple sclerosis: Secondary | ICD-10-CM

## 2023-12-29 DIAGNOSIS — F331 Major depressive disorder, recurrent, moderate: Secondary | ICD-10-CM | POA: Diagnosis not present

## 2023-12-30 ENCOUNTER — Ambulatory Visit
Admission: RE | Admit: 2023-12-30 | Discharge: 2023-12-30 | Disposition: A | Discharge status: Home / Self Care | Source: Ambulatory Visit | Attending: Neurology | Admitting: Neurology

## 2023-12-30 DIAGNOSIS — R200 Anesthesia of skin: Secondary | ICD-10-CM | POA: Diagnosis not present

## 2023-12-30 DIAGNOSIS — G35D Multiple sclerosis, unspecified: Secondary | ICD-10-CM | POA: Insufficient documentation

## 2023-12-30 DIAGNOSIS — R202 Paresthesia of skin: Secondary | ICD-10-CM | POA: Diagnosis not present

## 2023-12-30 DIAGNOSIS — R519 Headache, unspecified: Secondary | ICD-10-CM | POA: Diagnosis not present

## 2024-01-02 ENCOUNTER — Telehealth (INDEPENDENT_AMBULATORY_CARE_PROVIDER_SITE_OTHER): Admitting: Psychiatry

## 2024-01-02 DIAGNOSIS — F331 Major depressive disorder, recurrent, moderate: Secondary | ICD-10-CM

## 2024-01-02 DIAGNOSIS — F411 Generalized anxiety disorder: Secondary | ICD-10-CM | POA: Diagnosis not present

## 2024-01-02 DIAGNOSIS — F1290 Cannabis use, unspecified, uncomplicated: Secondary | ICD-10-CM

## 2024-01-02 MED ORDER — SERTRALINE HCL 100 MG PO TABS: 100.0000 mg | ORAL_TABLET | Freq: Every day | ORAL | 0 refills | Status: AC

## 2024-01-02 MED ORDER — HYDROXYZINE HCL 10 MG PO TABS: ORAL_TABLET | ORAL | 0 refills | Status: AC

## 2024-01-02 NOTE — Progress Notes (Signed)
 BH MD/PA/NP OP Progress Note  01/02/2024 9:36 AM Sharon Salazar  MRN:  [id number]  Chief Complaint: Routine Follow-up  Virtual Visit via Video Note  I connected with Sharon Salazar on 01/02/24 at  9:30 AM EDT by a video enabled telemedicine application and verified that I am speaking with the correct person using two identifiers.  Location: Patient: [address] [place] [address]  Provider: [facility] Office of Provider   I discussed the limitations of evaluation and management by telemedicine and the availability of in person appointments. The patient expressed understanding and agreed to proceed.    I discussed the assessment and treatment plan with the patient. The patient was provided an opportunity to ask questions and all were answered. The patient agreed with the plan and demonstrated an understanding of the instructions.   The patient was advised to call back or seek an in-person evaluation if the symptoms worsen or if the condition fails to improve as anticipated.  I provided 30 minutes of non-face-to-face time during this encounter.   Dorn Jama Der, NP  Today in [place] for follow-up.  Patient reports that she is not doing well at home stating that her anxiety has gotten out of control.  Patient reports she has had multiple episodes of anxiety attacks while driving as well as at home that she feels is really difficult to control.  Patient reports that she has been taking medications as prescribed but states that is on or off depending on the day stating it is like a 50-50 chance of the medication working.  Patient does endorse life stressors in regards to her relationship and work.  Patient reports that she is currently at [facility] for group therapy but states that it is not really engaging her like she imagined.  And states that she would like to seek a therapist individually.  Patient reports that she would like to spend more time with her therapist individually to  be able to vent as well as to share her current life stresses so she could continue working on them.  Patient will be recommended to TLC counseling with Damien Groves.  Patien sertraline  has been increased to 100 mg once daily with the new prescription sent to the pharmacy.  Patient has also been prescribed hydroxyzine  10 to 20 mg 3 times a day as needed for anxiety.  Patient with no other questions or concerns at this time.  Patient denies SI, HI, AVH.  Patient is in agreement with treatment plan.  Patient to follow-up in 1 month. Visit Diagnosis:    ICD-10-CM   1. MDD (major depressive disorder), recurrent episode, moderate (HCC)  F33.1     2. Generalized anxiety disorder  F41.1     3. Cannabis use disorder  F12.90       Past Psychiatric History:  Previous Psych Hospitalizations: 33 years old, [facility], diagnosed with bipolar disorder according to patient based on her behavior with her mother at the time.  No hospitalizations after 9 being 19.   Outpatient treatment: The patient being treated at her PCP by Dr. Epifanio.   Medications Current:  -Sertraline  100 mg once daily  -Hydroxyzine  10 to 20 mg 3 times a day as needed for anxiety Medication Trials: Fluoxetine discontinued in 2023 due to side effects.   Suicide & Violence: Denies SI, HI.  Denies any previous violence behaviors.   Psychotherapy: Not currently participating in psychotherapy or in the past.   Legal: Denies  legal at this time.  Past Medical History:  Past Medical History:  Diagnosis Date   Anemia    Anxiety    Asthma     Past Surgical History:  Procedure Laterality Date   left arm surgery Left    tonsillectcomy      Family Psychiatric History: No additional  Family History:  Family History  Problem Relation Age of Onset   Anemia Mother    Sarcoidosis Maternal Grandmother     Social History:  Social History   Socioeconomic History   Marital status: Married    Spouse name: Not on  file   Number of children: Not on file   Years of education: Not on file   Highest education level: High school graduate  Occupational History   Not on file  Tobacco Use   Smoking status: Never   Smokeless tobacco: Never  Vaping Use   Vaping status: Never Used  Substance and Sexual Activity   Alcohol use: Not Currently    Comment: occasional   Drug use: Yes    Types: Marijuana   Sexual activity: Yes    Birth control/protection: Condom  Other Topics Concern   Not on file  Social History Narrative   Not on file   Social Drivers of Health   Financial Resource Strain: Medium Risk (12/20/2023)   Received from [facility]   Overall Financial Resource Strain (CARDIA)    Difficulty of Paying Living Expenses: Somewhat hard  Food Insecurity: Food Insecurity Present (12/20/2023)   Received from [facility]   Hunger Vital Sign    Within the past 12 months, you worried that your food would run out before you got the money to buy more.: Sometimes true    Within the past 12 months, the food you bought just didn't last and you didn't have money to get more.: Sometimes true  Transportation Needs: No Transportation Needs (12/20/2023)   Received from [facility] - Transportation    In the past 12 months, has lack of transportation kept you from medical appointments or from getting medications?: No    Lack of Transportation (Non-Medical): No  Physical Activity: Insufficiently Active (08/12/2023)   Received from [facility]   Exercise Vital Sign    On average, how many days per week do you engage in moderate to strenuous exercise (like a brisk walk)?: 2 days    On average, how many minutes do you engage in exercise at this level?: 20 min  Stress: Stress Concern Present (08/12/2023)   Received from [facility] - Occupational Stress Questionnaire    Feeling  of Stress : Very much  Social Connections: Moderately Isolated (08/12/2023)   Received from [facility]   Social Connection and Isolation Panel    In a typical week, how many times do you talk on the phone with family, friends, or neighbors?: Once a week    How often do you get together with friends or relatives?: Once a week    How often do you attend church or religious services?: 1 to 4 times per year    Do you belong to any clubs or organizations such as church groups, unions, fraternal or athletic groups, or school groups?: No    How often do you attend meetings of the clubs or organizations you belong to?: Never    Are you married, widowed, divorced,  separated, never married, or living with a partner?: Married    Allergies:  Allergies  Allergen Reactions   Shellfish Allergy Anaphylaxis and Nausea And Vomiting   Banana    Cranberry Itching    All berries   Other Other (See Comments)    Metabolic Disorder Labs: No results found for: HGBA1C, MPG No results found for: PROLACTIN No results found for: CHOL, TRIG, HDL, CHOLHDL, VLDL, LDLCALC Lab Results  Component Value Date   TSH 1.429 10/01/2020    Therapeutic Level Labs: No results found for: LITHIUM No results found for: VALPROATE No results found for: CBMZ  Current Medications: Current Outpatient Medications  Medication Sig Dispense Refill   albuterol  (VENTOLIN  HFA) 108 (90 Base) MCG/ACT inhaler Inhale 2 puffs into the lungs every 6 (six) hours as needed for wheezing or shortness of breath. 8 g 0   cetirizine (ZYRTEC) 10 MG tablet Take 1 tablet by mouth daily.     clobetasol ointment (TEMOVATE) 0.05 % Apply topically 2 (two) times daily.     clotrimazole -betamethasone  (LOTRISONE ) cream Apply 1 Application topically 2 (two) times daily. 30 g 0   EPINEPHrine  0.3 mg/0.3 mL IJ SOAJ injection Inject 0.3 mLs into the muscle as needed.     FEROSUL 325 (65 Fe) MG tablet Take 325 mg by  mouth every morning.     ibuprofen  (ADVIL ) 600 MG tablet Take 1 tablet (600 mg total) by mouth every 8 (eight) hours as needed. With food 30 tablet 0   sertraline  (ZOLOFT ) 50 MG tablet Take 1 tablet (50 mg total) by mouth daily. 90 tablet 1   No current facility-administered medications for this visit.     Musculoskeletal: Strength & Muscle Tone: within normal limits Gait & Station: normal Patient leans: N/A   Psychiatric Specialty Exam: Review of Systems  Constitutional: Negative.   HENT: Negative.    Eyes: Negative.   Respiratory: Negative.       There were no vitals taken for this visit.There is no height or weight on file to calculate BMI.  General Appearance: Well Groomed  Eye Contact:  Good  Speech:  Clear and Coherent  Volume:  Normal  Mood:  Depressed and Anxious  Affect:  Appropriate  Thought Process:  Coherent  Orientation:  Full (Time, Place, and Person)  Thought Content: Logical   Suicidal Thoughts:  No  Homicidal Thoughts:  No  Memory:  Immediate;   Good Recent;   Good Remote;   Good  Judgement:  Good  Insight:  Good  Psychomotor Activity:  Normal  Concentration:  Concentration: Good and Attention Span: Good  Recall:  Good  Fund of Knowledge: Good  Language: Good  Akathisia:  No  Handed:  Right  AIMS (if indicated):   Assets:  Desire for Improvement Financial Resources/Insurance Housing Vocational/Educational  ADL's:  Intact  Cognition: WNL  Sleep:  Fair   Screenings: GAD-7    Flowsheet Row Video Visit from 08/29/2023 in [facility]  Total GAD-7 Score 10   PHQ2-9    Flowsheet Row Office Visit from 11/24/2023 in [facility] Video Visit from 08/29/2023 in [facility] Video Visit from 08/01/2023 in [facility] Office Visit from 07/05/2023 in [facility]  PHQ-2 Total Score 0 2 6 6   PHQ-9 Total Score -- 7 24 21    Flowsheet Row Video  Visit from 08/01/2023 in [facility] Office Visit from 07/05/2023 in [facility] ED from 02/12/2023 in [facility]  C-SSRS RISK CATEGORY No Risk No Risk No Risk     Assessment and Plan:  Assessment - Diagnosis: Generalized anxiety disorder [F41.1]  2. MDD (major depressive disorder), recurrent episode, moderate (HCC) [F33.1]  3. 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 Cannabis use disorder [F12.90]  Differential Diagnosis: Bipolar Disorder 2. OCD - Progress: Pt reporting continued problems with the spouse, in which drinking in which she shared her concerns with no improvement. Pt does endorse improve in symptom management, stating she is satisfied with the current medication regimen.  - Risk Factors: Worsening symptoms.  Plan - Medications:   Continue sertraline  100 mg by mouth once a day, patient advised that should the patient experience confusion, tremors or palpitations to stop the medication and contact the clinic.  Patient educated on serotonin syndrome due to taking trazodone  and sertraline  together.  Patient has been advised about possible sexual dysfunction as well as GI irritability and has been instructed to contact clinic should these symptoms occur. Hydroxyzine  10-20mg  TID for anxiety.  Pt has been educated on the sedating nature of the medication but only take as needed. - Psychotherapy: Referral for individual therapy.  - Education: Patient educated on medications purpose as well as cannabis use.  Patient was educated on the relationship between cannabis and panic attacks as associated by the NIH.  The patient was encouraged to decrease the amount of marijuana use and to monitor her symptoms discussed on her next visit from reduction of  marijuana use and which she is in agreement with. - Follow-Up: Patient will follow up in 3 months. - Referrals: No referrals - Safety Planning: Patient was educated to call 911 or go to the emergency department should she have any suicidal thoughts with or without a plan.  Patient was also educated to contact the clinic should she have worsening symptoms before next visit.  Patient denies having a firearm within the home.  Patient/Guardian was advised Release of Information must be obtained prior to any record release in order to collaborate their care with an outside provider. Patient/Guardian was advised if they have not already done so to contact the registration department to sign all necessary forms in order for us  to release information regarding their care.   Consent: Patient/Guardian gives verbal consent for treatment and assignment of benefits for services provided during this visit. Patient/Guardian expressed understanding and agreed to proceed.    Dorn Jama Der, NP 01/02/2024, 9:36 AM

## 2024-01-05 DIAGNOSIS — F331 Major depressive disorder, recurrent, moderate: Secondary | ICD-10-CM | POA: Diagnosis not present

## 2024-01-12 DIAGNOSIS — M5412 Radiculopathy, cervical region: Secondary | ICD-10-CM | POA: Diagnosis not present

## 2024-01-25 DIAGNOSIS — F4312 Post-traumatic stress disorder, chronic: Secondary | ICD-10-CM | POA: Diagnosis not present

## 2024-01-26 DIAGNOSIS — F331 Major depressive disorder, recurrent, moderate: Secondary | ICD-10-CM | POA: Diagnosis not present

## 2024-01-30 ENCOUNTER — Telehealth: Admitting: Psychiatry

## 2024-01-30 DIAGNOSIS — F4312 Post-traumatic stress disorder, chronic: Secondary | ICD-10-CM | POA: Diagnosis not present

## 2024-02-09 DIAGNOSIS — F331 Major depressive disorder, recurrent, moderate: Secondary | ICD-10-CM | POA: Diagnosis not present

## 2024-02-16 ENCOUNTER — Other Ambulatory Visit: Payer: Self-pay

## 2024-02-16 ENCOUNTER — Encounter: Payer: Self-pay | Admitting: Emergency Medicine

## 2024-02-16 ENCOUNTER — Emergency Department: Admission: EM | Admit: 2024-02-16 | Discharge: 2024-02-16 | Disposition: A | Discharge status: Home / Self Care

## 2024-02-16 DIAGNOSIS — M542 Cervicalgia: Secondary | ICD-10-CM | POA: Insufficient documentation

## 2024-02-16 DIAGNOSIS — R1084 Generalized abdominal pain: Secondary | ICD-10-CM | POA: Diagnosis not present

## 2024-02-16 DIAGNOSIS — S4992XA Unspecified injury of left shoulder and upper arm, initial encounter: Secondary | ICD-10-CM | POA: Diagnosis present

## 2024-02-16 DIAGNOSIS — W19XXXA Unspecified fall, initial encounter: Secondary | ICD-10-CM | POA: Insufficient documentation

## 2024-02-16 DIAGNOSIS — S46812A Strain of other muscles, fascia and tendons at shoulder and upper arm level, left arm, initial encounter: Secondary | ICD-10-CM | POA: Diagnosis not present

## 2024-02-16 DIAGNOSIS — R109 Unspecified abdominal pain: Secondary | ICD-10-CM | POA: Diagnosis not present

## 2024-02-16 DIAGNOSIS — F331 Major depressive disorder, recurrent, moderate: Secondary | ICD-10-CM | POA: Diagnosis not present

## 2024-02-16 LAB — COMPREHENSIVE METABOLIC PANEL WITH GFR
ALT: 13 U/L (ref 0–44)
AST: 19 U/L (ref 15–41)
Albumin: 4 g/dL (ref 3.5–5.0)
Alkaline Phosphatase: 65 U/L (ref 38–126)
Anion gap: 8 (ref 5–15)
BUN: 7 mg/dL (ref 6–20)
CO2: 24 mmol/L (ref 22–32)
Calcium: 9.2 mg/dL (ref 8.9–10.3)
Chloride: 105 mmol/L (ref 98–111)
Creatinine, Ser: 0.71 mg/dL (ref 0.44–1.00)
GFR, Estimated: >60 mL/min (ref >60)
Glucose, Bld: 88 mg/dL (ref 70–99)
Potassium: 4.2 mmol/L (ref 3.5–5.1)
Sodium: 137 mmol/L (ref 135–145)
Total Bilirubin: 0.2 mg/dL (ref 0.0–1.2)
Total Protein: 6.5 g/dL (ref 6.5–8.1)

## 2024-02-16 LAB — CBC
HCT: 35.5 % — ABNORMAL LOW (ref 36.0–46.0)
Hemoglobin: 11.7 g/dL — ABNORMAL LOW (ref 12.0–15.0)
MCH: 30.9 pg (ref 26.0–34.0)
MCHC: 33 g/dL (ref 30.0–36.0)
MCV: 93.7 fL (ref 80.0–100.0)
Platelets: 201 K/uL (ref 150–400)
RBC: 3.79 MIL/uL — ABNORMAL LOW (ref 3.87–5.11)
RDW: 13.2 % (ref 11.5–15.5)
WBC: 5.3 K/uL (ref 4.0–10.5)
nRBC: 0 % (ref 0.0–0.2)

## 2024-02-16 LAB — URINALYSIS, ROUTINE W REFLEX MICROSCOPIC
Bilirubin Urine: NEGATIVE
Glucose, UA: NEGATIVE mg/dL
Hgb urine dipstick: NEGATIVE
Ketones, ur: NEGATIVE mg/dL
Leukocytes,Ua: NEGATIVE
Nitrite: NEGATIVE
Protein, ur: NEGATIVE mg/dL
Specific Gravity, Urine: 1.026 (ref 1.005–1.030)
pH: 5 (ref 5.0–8.0)

## 2024-02-16 LAB — LIPASE, BLOOD: Lipase: 38 U/L (ref 11–51)

## 2024-02-16 LAB — POC URINE PREG, ED: Preg Test, Ur: NEGATIVE

## 2024-02-16 MED ORDER — LIDOCAINE 5 % EX PTCH
1.0000 | MEDICATED_PATCH | CUTANEOUS | 0 refills | Status: AC
Start: 2024-02-16 — End: 2024-02-26

## 2024-02-16 MED ORDER — LIDOCAINE 5 % EX PTCH
1.0000 | MEDICATED_PATCH | CUTANEOUS | Status: DC
  Administered 2024-02-16: 1 via TRANSDERMAL
  Filled 2024-02-16: qty 1

## 2024-02-16 MED ORDER — KETOROLAC TROMETHAMINE 15 MG/ML IJ SOLN
15.0000 mg | Freq: Once | INTRAMUSCULAR | Status: AC
  Administered 2024-02-16: 15 mg via INTRAMUSCULAR
  Filled 2024-02-16: qty 1

## 2024-02-16 MED ORDER — METHOCARBAMOL 500 MG PO TABS: 500.0000 mg | ORAL_TABLET | Freq: Four times a day (QID) | ORAL | 0 refills | Status: AC

## 2024-02-16 MED ORDER — METHOCARBAMOL 500 MG PO TABS
750.0000 mg | ORAL_TABLET | Freq: Once | ORAL | Status: AC
  Administered 2024-02-16: 750 mg via ORAL
  Filled 2024-02-16: qty 2
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
## 2024-02-16 NOTE — ED Notes (Signed)
 Pt given DC instructions. Pt verbalized understanding of medications and follow up care. Pt ambulatory from ED without difficulty.

## 2024-02-16 NOTE — Discharge Instructions (Signed)
 1) try heat packs and massage to the area 2) please discuss with your primary care physician referral to physical therapy 3) return with any acutely worsening symptoms or any other emergency --  RETURN PRECAUTIONS & AFTERCARE: (ENGLISH) RETURN PRECAUTIONS: Return immediately to the emergency department or see/call your doctor if you feel worse, weak or have changes in speech or vision, are short of breath, have fever, vomiting, pain, bleeding or dark stool, trouble urinating or any new issues. Return here or see/call your doctor if not improving as expected for your suspected condition. FOLLOW-UP CARE: Call your doctor and/or any doctors we referred you to for more advice and to make an appointment. Do this today, tomorrow or after the weekend. Some doctors only take PPO insurance so if you have HMO insurance you may want to contact your HMO or your regular doctor for referral to a specialist within your plan. Either way tell the doctor's office that it was a referral from the emergency department so you get the soonest possible appointment.  YOUR TEST RESULTS: Take result reports of any blood or urine tests, imaging tests and EKG's to your doctor and any referral doctor. Have any abnormal tests repeated. Your doctor or a referral doctor can let you know when this should be done. Also make sure your doctor contacts this hospital to get any test results that are not currently available such as cultures or special tests for infection and final imaging reports, which are often not available at the time you leave the ER but which may list additional important findings that are not documented on the preliminary report. BLOOD PRESSURE: If your blood pressure was greater than 120/80 have your blood pressure rechecked within 1 to 2 weeks. MEDICATION SIDE EFFECTS: Do not drive, walk, bike, take the bus, etc. if you have received or are being prescribed any sedating medications such as those for pain or anxiety or  certain antihistamines like Benadryl. If you have been give one of these here get a taxi home or have a friend drive you home. Ask your pharmacist to counsel you on potential side effects of any new medication

## 2024-02-20 DIAGNOSIS — F4312 Post-traumatic stress disorder, chronic: Secondary | ICD-10-CM | POA: Diagnosis not present

## 2024-02-20 DIAGNOSIS — F411 Generalized anxiety disorder: Secondary | ICD-10-CM | POA: Diagnosis not present

## 2024-03-13 DIAGNOSIS — Z0000 Encounter for general adult medical examination without abnormal findings: Secondary | ICD-10-CM | POA: Diagnosis not present

## 2024-03-28 DIAGNOSIS — R059 Cough, unspecified: Secondary | ICD-10-CM | POA: Diagnosis not present

## 2024-03-28 DIAGNOSIS — J45901 Unspecified asthma with (acute) exacerbation: Secondary | ICD-10-CM | POA: Diagnosis not present

## 2024-05-24 ENCOUNTER — Other Ambulatory Visit

## 2024-05-31 ENCOUNTER — Ambulatory Visit: Admitting: Oncology

## 2024-05-31 ENCOUNTER — Ambulatory Visit
